# Patient Record
Sex: Female | Born: 1975 | Race: Black or African American | Hispanic: No | Marital: Married | Smoking: Never smoker
Health system: Southern US, Community
[De-identification: ages and names within clinical notes are randomized; demographics above are authoritative.]

## PROBLEM LIST (undated history)

## (undated) DIAGNOSIS — K5900 Constipation, unspecified: Secondary | ICD-10-CM

## (undated) DIAGNOSIS — IMO0001 Reserved for inherently not codable concepts without codable children: Secondary | ICD-10-CM

## (undated) DIAGNOSIS — R7309 Other abnormal glucose: Secondary | ICD-10-CM

## (undated) DIAGNOSIS — Z789 Other specified health status: Secondary | ICD-10-CM

## (undated) DIAGNOSIS — J302 Other seasonal allergic rhinitis: Secondary | ICD-10-CM

## (undated) DIAGNOSIS — R7989 Other specified abnormal findings of blood chemistry: Secondary | ICD-10-CM

## (undated) DIAGNOSIS — K219 Gastro-esophageal reflux disease without esophagitis: Secondary | ICD-10-CM

## (undated) DIAGNOSIS — D219 Benign neoplasm of connective and other soft tissue, unspecified: Secondary | ICD-10-CM

## (undated) HISTORY — DX: Reserved for inherently not codable concepts without codable children: IMO0001

## (undated) HISTORY — PX: COLONOSCOPY: SHX174

## (undated) HISTORY — DX: Other specified abnormal findings of blood chemistry: R79.89

## (undated) HISTORY — PX: WISDOM TOOTH EXTRACTION: SHX21

## (undated) HISTORY — PX: BREAST SURGERY: SHX581

## (undated) HISTORY — DX: Other specified health status: Z78.9

## (undated) HISTORY — DX: Benign neoplasm of connective and other soft tissue, unspecified: D21.9

## (undated) HISTORY — DX: Other abnormal glucose: R73.09

## (undated) HISTORY — PX: ESOPHAGOGASTRODUODENOSCOPY ENDOSCOPY: SHX5814

---

## 1998-04-17 ENCOUNTER — Other Ambulatory Visit: Admission: RE | Admit: 1998-04-17 | Discharge: 1998-04-17 | Payer: Self-pay | Admitting: Gynecology

## 1998-06-10 HISTORY — PX: REDUCTION MAMMAPLASTY: SUR839

## 1998-09-30 ENCOUNTER — Emergency Department (HOSPITAL_COMMUNITY): Admission: EM | Admit: 1998-09-30 | Discharge: 1998-09-30 | Payer: Self-pay | Admitting: Emergency Medicine

## 1998-10-24 ENCOUNTER — Other Ambulatory Visit: Admission: RE | Admit: 1998-10-24 | Discharge: 1998-10-24 | Payer: Self-pay | Admitting: Gynecology

## 1999-05-24 ENCOUNTER — Encounter (INDEPENDENT_AMBULATORY_CARE_PROVIDER_SITE_OTHER): Payer: Self-pay | Admitting: Specialist

## 1999-05-24 ENCOUNTER — Other Ambulatory Visit: Admission: RE | Admit: 1999-05-24 | Discharge: 1999-05-24 | Payer: Self-pay | Admitting: Plastic Surgery

## 1999-12-11 ENCOUNTER — Other Ambulatory Visit: Admission: RE | Admit: 1999-12-11 | Discharge: 1999-12-11 | Payer: Self-pay | Admitting: Gynecology

## 2001-02-10 ENCOUNTER — Other Ambulatory Visit: Admission: RE | Admit: 2001-02-10 | Discharge: 2001-02-10 | Payer: Self-pay | Admitting: Gynecology

## 2001-04-16 ENCOUNTER — Other Ambulatory Visit: Admission: RE | Admit: 2001-04-16 | Discharge: 2001-04-16 | Payer: Self-pay | Admitting: Gynecology

## 2002-03-24 ENCOUNTER — Other Ambulatory Visit: Admission: RE | Admit: 2002-03-24 | Discharge: 2002-03-24 | Payer: Self-pay | Admitting: Gynecology

## 2003-04-04 ENCOUNTER — Other Ambulatory Visit: Admission: RE | Admit: 2003-04-04 | Discharge: 2003-04-04 | Payer: Self-pay | Admitting: Gynecology

## 2004-04-05 ENCOUNTER — Other Ambulatory Visit: Admission: RE | Admit: 2004-04-05 | Discharge: 2004-04-05 | Payer: Self-pay | Admitting: Gynecology

## 2005-04-24 ENCOUNTER — Other Ambulatory Visit: Admission: RE | Admit: 2005-04-24 | Discharge: 2005-04-24 | Payer: Self-pay | Admitting: Gynecology

## 2006-05-05 ENCOUNTER — Other Ambulatory Visit: Admission: RE | Admit: 2006-05-05 | Discharge: 2006-05-05 | Payer: Self-pay | Admitting: Gynecology

## 2007-05-04 ENCOUNTER — Other Ambulatory Visit: Admission: RE | Admit: 2007-05-04 | Discharge: 2007-05-04 | Payer: Self-pay | Admitting: Gynecology

## 2008-05-23 ENCOUNTER — Ambulatory Visit: Payer: Self-pay | Admitting: Gynecology

## 2008-05-23 ENCOUNTER — Other Ambulatory Visit: Admission: RE | Admit: 2008-05-23 | Discharge: 2008-05-23 | Payer: Self-pay | Admitting: Gynecology

## 2008-05-23 ENCOUNTER — Encounter: Payer: Self-pay | Admitting: Gynecology

## 2009-11-28 ENCOUNTER — Inpatient Hospital Stay (HOSPITAL_COMMUNITY): Admission: RE | Admit: 2009-11-28 | Discharge: 2009-11-30 | Payer: Self-pay | Admitting: Obstetrics and Gynecology

## 2009-11-28 ENCOUNTER — Encounter (INDEPENDENT_AMBULATORY_CARE_PROVIDER_SITE_OTHER): Payer: Self-pay | Admitting: Obstetrics and Gynecology

## 2009-11-29 HISTORY — PX: MYOMECTOMY: SHX85

## 2010-01-07 ENCOUNTER — Encounter: Admission: RE | Admit: 2010-01-07 | Discharge: 2010-01-07 | Payer: Self-pay | Admitting: Obstetrics and Gynecology

## 2010-07-01 ENCOUNTER — Encounter: Payer: Self-pay | Admitting: Obstetrics and Gynecology

## 2010-08-26 LAB — URINALYSIS, ROUTINE W REFLEX MICROSCOPIC
Glucose, UA: NEGATIVE mg/dL
Ketones, ur: NEGATIVE mg/dL
Protein, ur: NEGATIVE mg/dL
pH: 6 (ref 5.0–8.0)

## 2010-08-26 LAB — CBC
Hemoglobin: 12.4 g/dL (ref 12.0–15.0)
Hemoglobin: 9.9 g/dL — ABNORMAL LOW (ref 12.0–15.0)
MCHC: 34.3 g/dL (ref 30.0–36.0)
RBC: 3.01 MIL/uL — ABNORMAL LOW (ref 3.87–5.11)
RDW: 13.2 % (ref 11.5–15.5)
WBC: 5.8 10*3/uL (ref 4.0–10.5)
WBC: 8.3 10*3/uL (ref 4.0–10.5)

## 2010-08-26 LAB — PROTIME-INR: INR: 0.99 (ref 0.00–1.49)

## 2010-08-26 LAB — COMPREHENSIVE METABOLIC PANEL
Calcium: 9.2 mg/dL (ref 8.4–10.5)
Chloride: 107 mEq/L (ref 96–112)
GFR calc non Af Amer: >60 mL/min (ref >60)

## 2010-08-26 LAB — URINE MICROSCOPIC-ADD ON

## 2010-08-26 LAB — PREGNANCY, URINE: Preg Test, Ur: NEGATIVE

## 2011-01-01 ENCOUNTER — Other Ambulatory Visit: Payer: Self-pay | Admitting: Obstetrics and Gynecology

## 2011-01-01 DIAGNOSIS — R928 Other abnormal and inconclusive findings on diagnostic imaging of breast: Secondary | ICD-10-CM

## 2011-01-15 ENCOUNTER — Other Ambulatory Visit: Payer: Self-pay

## 2011-01-18 ENCOUNTER — Ambulatory Visit
Admission: RE | Admit: 2011-01-18 | Discharge: 2011-01-18 | Disposition: A | Discharge status: Home / Self Care | Payer: 59 | Source: Ambulatory Visit | Attending: Obstetrics and Gynecology | Admitting: Obstetrics and Gynecology

## 2011-01-18 DIAGNOSIS — R928 Other abnormal and inconclusive findings on diagnostic imaging of breast: Secondary | ICD-10-CM

## 2011-04-12 ENCOUNTER — Other Ambulatory Visit: Payer: Self-pay | Admitting: Obstetrics and Gynecology

## 2014-02-25 ENCOUNTER — Other Ambulatory Visit: Payer: Self-pay | Admitting: Gastroenterology

## 2014-04-22 ENCOUNTER — Other Ambulatory Visit: Payer: Self-pay | Admitting: Physician Assistant

## 2016-04-29 ENCOUNTER — Other Ambulatory Visit: Payer: Self-pay | Admitting: Obstetrics and Gynecology

## 2016-04-29 DIAGNOSIS — D259 Leiomyoma of uterus, unspecified: Secondary | ICD-10-CM

## 2016-05-07 ENCOUNTER — Ambulatory Visit
Admission: RE | Admit: 2016-05-07 | Discharge: 2016-05-07 | Disposition: A | Discharge status: Home / Self Care | Payer: 59 | Source: Ambulatory Visit | Attending: Obstetrics and Gynecology | Admitting: Obstetrics and Gynecology

## 2016-05-07 ENCOUNTER — Other Ambulatory Visit (HOSPITAL_COMMUNITY): Payer: Self-pay | Admitting: Interventional Radiology

## 2016-05-07 DIAGNOSIS — D251 Intramural leiomyoma of uterus: Secondary | ICD-10-CM

## 2016-05-07 DIAGNOSIS — D259 Leiomyoma of uterus, unspecified: Secondary | ICD-10-CM

## 2016-05-07 HISTORY — PX: IR GENERIC HISTORICAL: IMG1180011

## 2016-05-07 NOTE — Consult Note (Signed)
Chief Complaint:  systematic uterine fibroids with dysfunctional uterine bleeding.  Referring Physician(s): Callahan,Sidney  History of Present Illness: Penny Edwards is a 40 y.o. female with known symptomatic uterine fibroids. Her abnormal menstrual bleeding has progressed. Review of her menstrual cycle was performed. She has a 7 day cycle with 3 heavy days and passage of fresh blood clots. Frequency of pad changes every 2 hours. She now experiences interperiod bleeding as well. Fibroids were originally diagnosed 2011. She has had a remote open myomectomy in June 2011. No future pregnancy plans. No menopausal symptoms. She currently is on birth control pills. No recent GYN infections. Office ultrasound demonstrates at least 8 uterine fibroids with an overall uterine length of 16.5 cm.  No past medical history on file.  No past surgical history on file.  Allergies: Patient has no known allergies.  Medications: Prior to Admission medications   Medication Sig Start Date End Date Taking? Authorizing Provider  norethindrone-ethinyl estradiol (JUNEL FE,GILDESS FE,LOESTRIN FE) 1-20 MG-MCG tablet Take 1 tablet by mouth daily.   Yes Historical Provider, MD     No family history on file.  Social History   Social History  . Marital status: Married    Spouse name: N/A  . Number of children: N/A  . Years of education: N/A   Social History Main Topics  . Smoking status: Never Smoker  . Smokeless tobacco: Never Used  . Alcohol use Yes     Comment: Occasional    . Drug use: No  . Sexual activity: Not on file   Other Topics Concern  . Not on file   Social History Narrative  . No narrative on file     Review of Systems: A 12 point ROS discussed and pertinent positives are indicated in the HPI above.  All other systems are negative.  Review of Systems  Vital Signs: BP (!) 134/91 (BP Location: Left Arm, Patient Position: Sitting, Cuff Size: Normal)   Pulse 74   Temp  98.6 F (37 C) (Oral)   Resp 14   Ht 5\' 6"  (1.676 m)   Wt 197 lb (89.4 kg)   LMP 04/30/2016 (Exact Date)   SpO2 100%   BMI 31.80 kg/m   Physical Exam  Constitutional: She is oriented to person, place, and time. She appears well-developed and well-nourished. No distress.  Eyes: Conjunctivae are normal. No scleral icterus.  Cardiovascular: Normal rate, regular rhythm, normal heart sounds and intact distal pulses.   No murmur heard. Pulmonary/Chest: Effort normal. No respiratory distress. She has no wheezes.  Abdominal: Soft. Bowel sounds are normal. She exhibits no distension. There is no tenderness.  Palpable firm nontender lower abdominal mass extends to the pelvis compatible with an enlarged fibroid uterus. This is approximately 4 cm below the umbilicus compatible with approximate 16 weeks' size.  Musculoskeletal: She exhibits no edema or tenderness.  Neurological: She is alert and oriented to person, place, and time.  Skin: Skin is warm and dry. She is not diaphoretic. No erythema.  Psychiatric: She has a normal mood and affect. Her behavior is normal. Thought content normal.    Mallampati Score:   2  Imaging: No results found.  Labs:  CBC: No results for input(s): WBC, HGB, HCT, PLT in the last 8760 hours.  COAGS: No results for input(s): INR, APTT in the last 8760 hours.  BMP: No results for input(s): NA, K, CL, CO2, GLUCOSE, BUN, CALCIUM, CREATININE, GFRNONAA, GFRAA in the last  8760 hours.  Invalid input(s): CMP  LIVER FUNCTION TESTS: No results for input(s): BILITOT, AST, ALT, ALKPHOS, PROT, ALBUMIN in the last 8760 hours.  Assessment and Plan:  Recurrent enlarging systematic uterine fibroids despite 2011 myomectomy. Recurrence of the dysfunctional uterine bleeding with passage of blood clots and interperiod bleeding. Uterine fibroid embolization procedure was reviewed in detail. The procedure, risks, benefits and alternatives were reviewed as well as the  outcomes and goals. All questions were addressed. She has a clear understanding of the procedure.  She would like to proceed with the workup. This would include a pelvic MRI without and with contrast to assess fibroid anatomy for embolization.  Plan: Schedule for outpatient pelvic MRI without and with contrast. Obtain recent Pap smear results Anticipate uterine fibroid embolization in the next 4-12 weeks. She will need to be off the birth control pills for the procedure.  Thank you for this interesting consult.  I greatly enjoyed meeting PETE STRIANO and look forward to participating in their care.  A copy of this report was sent to the requesting provider on this date.  Electronically Signed: Greggory Keen 05/07/2016, 10:41 AM   I spent a total of  40 Minutes   in face to face in clinical consultation, greater than 50% of which was counseling/coordinating care for this patient with recurrent systematic uterine fibroids and abnormal menstrual bleeding.

## 2016-05-13 ENCOUNTER — Other Ambulatory Visit (HOSPITAL_COMMUNITY): Payer: 59

## 2016-05-14 ENCOUNTER — Other Ambulatory Visit: Payer: Self-pay | Admitting: Obstetrics and Gynecology

## 2016-05-29 ENCOUNTER — Encounter: Payer: Self-pay | Admitting: Interventional Radiology

## 2016-06-12 ENCOUNTER — Ambulatory Visit (INDEPENDENT_AMBULATORY_CARE_PROVIDER_SITE_OTHER): Payer: 59 | Admitting: Obstetrics and Gynecology

## 2016-06-12 ENCOUNTER — Encounter: Payer: Self-pay | Admitting: Obstetrics and Gynecology

## 2016-06-12 VITALS — BP 140/82 | HR 80 | Resp 16 | Ht 66.0 in | Wt 203.2 lb

## 2016-06-12 DIAGNOSIS — D259 Leiomyoma of uterus, unspecified: Secondary | ICD-10-CM

## 2016-06-12 DIAGNOSIS — N921 Excessive and frequent menstruation with irregular cycle: Secondary | ICD-10-CM

## 2016-06-12 LAB — CBC
HEMATOCRIT: 38.5 % (ref 35.0–45.0)
HEMOGLOBIN: 12.8 g/dL (ref 11.7–15.5)
MCH: 32.1 pg (ref 27.0–33.0)
MCHC: 33.2 g/dL (ref 32.0–36.0)
MCV: 96.5 fL (ref 80.0–100.0)
MPV: 9.3 fL (ref 7.5–12.5)
Platelets: 270 10*3/uL (ref 140–400)
RBC: 3.99 MIL/uL (ref 3.80–5.10)
RDW: 13.9 % (ref 11.0–15.0)
WBC: 6.4 10*3/uL (ref 3.8–10.8)

## 2016-06-12 LAB — TSH: TSH: 1.32 m[IU]/L

## 2016-06-12 NOTE — Progress Notes (Signed)
41 y.o. G0 Married [place] American female here for second opinion regarding uterine fibroids.    Patient is a prior patient of mine and has hx of fibroids.  She is status post myomectomy 11/29/09. She did not have all fibroids removed at that time, as she desired fertility and had a fibroid that I thought would cause her to have hysterectomy if it were removed.  Now is having heavy cycles and breakthrough bleeding.  First 3 days, menses are really heavy with pad change every 3 hours.  Having abdominal tightness.  No back pain.  No urinary frequency or incontinence.  No dyspareunia. Clothing tight in abdomen.  Early satiety.  Cannot lay on her abdomen due to the size of her abdomen.  Uncomfortable.   Has been consulting with Dr. Rogue Bussing of [facility]. Had ultrasound in November 2017 - multiple fibroids with rough average size each of: 5 cm, 3 cm, 2.7 cm, 7.5 cm, 4 cm, 4.5 cm, 2.5 cm Had EMB on 05/14/16 showing benign endometrium with progestional effect. No atypia or malignancy noted.   Had consultation for uterine artery embolization.  Scheduled for an MRI in a couple of weeks.   Not planning on childbearing.  No hx of ovarian, uterine cancer.  M aunt with breast cancer.   PCP:   None  Patient's last menstrual period was 05/28/2016 (exact date).     Period Cycle (Days): 30 Period Duration (Days): 5-6 Period Pattern: Regular (having breakthrough bleeding) Menstrual Flow: Heavy Menstrual Control: Maxi pad Menstrual Control Change Freq (Hours): every 1.5 hours Dysmenorrhea: (!) Mild Dysmenorrhea Symptoms: Cramping, Diarrhea     Sexually active: Yes.   female The current method of family planning is OCP (estrogen/progesterone)--Loestrin 1/20.    Exercising: No.   Smoker:  no  Health Maintenance: Pap:  04-27-13 Neg:Neg HR HPV--patient thinks she had pap 05-01-16 which was normal.  There is no documentation in her chart that this was done in 2017 upon further review  after her office visit was complete today.  History of abnormal Pap:  no MMG:  05-01-16 Neg, BiRads1 with [facility] Colonoscopy:  2015 negative;next due 2018. Done for acid reflux periodically during each year from Feb to May.  BMD:   n/a  Result  n/a TDaP:  Up to date Gardasil:   NA   reports that she has never smoked. She has never used smokeless tobacco. She reports that she drinks about 2.4 oz of alcohol per week . She reports that she does not use drugs.  Past Medical History:  Diagnosis Date  . Fibroids   . Patient is Jehovah's Witness     Past Surgical History:  Procedure Laterality Date  . BREAST SURGERY     breast reduction  . IR GENERIC HISTORICAL  05/07/2016   IR RADIOLOGIST EVAL & MGMT 05/07/2016 Greggory Keen, MD [facility] INTERV RAD  . MYOMECTOMY  11/29/2009   Dr.Sherif Millspaugh Quincy Simmonds    Current Outpatient Prescriptions  Medication Sig Dispense Refill  . norethindrone-ethinyl estradiol (JUNEL FE,GILDESS FE,LOESTRIN FE) 1-20 MG-MCG tablet Take 1 tablet by mouth daily.     No current facility-administered medications for this visit.     Family History  Problem Relation Age of Onset  . Diabetes Mother   . Thyroid disease Mother     hyperthyroid  . Diabetes Father   . Hypertension Father   . Hypertension Sister   . Breast cancer Maternal Aunt 58    A & W  . Stroke  Paternal Grandmother   . Hypertension Sister     ROS:  Pertinent items are noted in HPI.  Otherwise, a comprehensive ROS was negative.  Exam:   BP 140/82 (BP Location: Right Arm, Patient Position: Sitting, Cuff Size: Normal)   Pulse 80   Resp 16   Ht 5\' 6"  (1.676 m)   Wt 203 lb 3.2 oz (92.2 kg)   LMP 05/28/2016 (Exact Date)   BMI 32.80 kg/m     General appearance: alert, cooperative and appears stated age Head: Normocephalic, without obvious abnormality, atraumatic Neck: no adenopathy, supple, symmetrical, trachea midline and thyroid normal to inspection and palpation Lungs: clear to  auscultation bilaterally Heart: regular rate and rhythm Abdomen: 25 week size uterus, fills pelvis side to side, non-tender; no masses, no organomegaly Extremities: extremities normal, atraumatic, no cyanosis or edema Skin: Skin color, texture, turgor normal. No rashes or lesions.  No abnormal inguinal nodes palpated Neurologic: Grossly normal  Pelvic: External genitalia:  no lesions              Urethra:  normal appearing urethra with no masses, tenderness or lesions              Bartholins and Skenes: normal                 Vagina: normal appearing vagina with normal color and discharge, no lesions              Cervix: no lesions             Bimanual Exam:  Uterus:   25 week size uterus, fills pelvis.              Adnexa: no mass, fullness, tenderness              Rectal exam: Yes.  .  Confirms.              Anus:  normal sphincter tone, no lesions  Chaperone was present for exam.  Assessment:   Symptomatic fibroids. Large uterine size.  Status post abdominal myomectomy. Jehovah's witness. On combined OCPs.  Plan:   We discussed hysterectomy versus uterine artery embolization.  Embolization will not give her significant relief of the volume that the uterus occupies at this time.  We discussed Depo Lupron therapy for at least 3 months, possibly longer in order to try to get a 40% reduction in her uterine volume prior to hysterectomy.  We will have her return in 3 months to assess her process with the Depo Lupron.  In planning for surgery, I would have her potentially do a uterine artery embolization pre-op in order to reduce bleeding at the time of hysterectomy. Routes of surgery discussed including abdominal and laparoscopic hysterectomy.  Check TSH and CBC today.  Patient wishes to proceed forward with her care of fibroids through this office.  She will cancel her pelvic MRI for now.  We can address this later when she has her anticipated presurgery embolization done.  We  will need a copy of her documents outlining her wishes for avoidance or acceptance of blood and blood substitutes. Follow up annually and prn.   _45______ minutes face to face time of which over 50% was spent in counseling.     After visit summary provided.

## 2016-06-12 NOTE — Progress Notes (Signed)
Order for Lupron Depot 11.25 mg faxed to [employer] for precert. Discussed with patient and aware to expect call regarding benefit information.

## 2016-06-13 ENCOUNTER — Encounter: Payer: Self-pay | Admitting: Obstetrics and Gynecology

## 2016-06-18 ENCOUNTER — Telehealth: Payer: Self-pay | Admitting: Obstetrics and Gynecology

## 2016-06-18 ENCOUNTER — Other Ambulatory Visit (HOSPITAL_COMMUNITY): Payer: 59

## 2016-06-18 NOTE — Telephone Encounter (Signed)
Patient is calling to let Gay Filler know she has not heard from her insurance yet regarding Lupron.

## 2016-06-18 NOTE — Telephone Encounter (Signed)
Prior authorization form to your desk for review and signature. Will close encounter and await Lupron arrival.  Patient is currently on OCP and is aware the Lupron is to be given with next cycle.   Routing to provider for final review. Patient agreeable to disposition. Will close encounter.

## 2016-06-18 NOTE — Telephone Encounter (Signed)
Return call to [place] at [employer]. Arbie Cookey unavailable. Spoke to [place]. Confirming patient benefit fax received and prior auth info had been sent. Advised this information has been received and is in process.

## 2016-06-18 NOTE — Telephone Encounter (Signed)
Returned call from patient. She states she has received call from [employer] regarding Lupron and is aware it is now being sent to speciality pharmacy. Advised we have received this information as well and are working on prior authorization forms for [employer]. She should expect to hear from them when ready to ship.

## 2016-06-18 NOTE — Telephone Encounter (Signed)
Patient's pharmacy called requesting to speak with Gay Filler re: Lupron.  They'd like to know if we received the prior authorization details yet.

## 2016-06-18 NOTE — Telephone Encounter (Signed)
Return call to patient. Left message to call back. 

## 2016-06-19 ENCOUNTER — Telehealth: Payer: Self-pay | Admitting: Obstetrics and Gynecology

## 2016-06-19 NOTE — Telephone Encounter (Signed)
Spoke with [employer]. Verification needed on Rx sent for Lupron from [facility]. Advised Rx is to be for Lupron Depot 11.25 mg (3 month supply) administer IM once every 3 months #1 kit 1RF. Representative verbalizes understanding. States PA submitted to [facility] today is still under review and our office will be contacted once a decision has been made.  Cc: Lamont Snowball, RN  Routing to provider for final review. Patient agreeable to disposition. Will close encounter.

## 2016-06-19 NOTE — Telephone Encounter (Signed)
[facility] needs clarification on a script that was sent in to them. Phone # [phone] Ref# [id number]

## 2016-06-20 ENCOUNTER — Telehealth: Payer: Self-pay | Admitting: Obstetrics and Gynecology

## 2016-06-20 DIAGNOSIS — D259 Leiomyoma of uterus, unspecified: Secondary | ICD-10-CM

## 2016-06-20 NOTE — Telephone Encounter (Signed)
Return call to patient. She received call from [facility] regarding Lupron. Had questions regarding copay card.  After confirming with Lupron rep Darnelle Maffucci), advised co pay card provides $125 off one month and $250 off 3 month supply. She should call [facility] and advise them she has a copay card. Call back to patient and information given. Advised we have limited ability to provide any further information regarding pharmacy plan. Patient will call [facility] and let me know if any further issues.

## 2016-06-20 NOTE — Telephone Encounter (Signed)
Patient wants to speak with Gay Filler.

## 2016-06-21 NOTE — Telephone Encounter (Signed)
Lupron injection received and patient notified. States she is on oral contraceptive pills and menses is due next week. She will call with menses. Advised Dr Quincy Simmonds will review and we will notify her of any changes to current instruction.   Per Dr Elza Rafter original order, plan Lupron Depot 11.25 mg and then have office visit in 3 months for re-assessment. Will schedule in 10 weeks with pelvic ultrasound.   Routing to provider for final review. Patient agreeable to disposition. Will close encounter.

## 2016-06-24 ENCOUNTER — Telehealth: Payer: Self-pay | Admitting: Obstetrics and Gynecology

## 2016-06-24 NOTE — Telephone Encounter (Signed)
Spoke with patient. Patient started her menses today and would like to schedule her Lupron injection. Injection has arrived at the office. Patient is unable to come today for injection. Appointment scheduled for tomorrow 06/25/2016 at 9 am. Patient is agreeable to date and time. 10 week follow up PUS scheduled on 08/29/2016 at 3 pm with 3:30 pm consult with Dr.Silva. Patient is agreeable to date and time.  Cc: Lamont Snowball, RN  Routing to provider for final review. Patient agreeable to disposition. Will close encounter.

## 2016-06-24 NOTE — Telephone Encounter (Signed)
Patient started cycle today and calling to schedule an appointment for the lupron injection.

## 2016-06-25 ENCOUNTER — Ambulatory Visit (INDEPENDENT_AMBULATORY_CARE_PROVIDER_SITE_OTHER): Payer: 59

## 2016-06-25 VITALS — BP 120/80 | HR 68 | Resp 12 | Ht 66.0 in | Wt 201.4 lb

## 2016-06-25 DIAGNOSIS — D259 Leiomyoma of uterus, unspecified: Secondary | ICD-10-CM | POA: Diagnosis not present

## 2016-06-25 MED ORDER — LEUPROLIDE ACETATE (3 MONTH) 11.25 MG IM KIT
11.2500 mg | PACK | Freq: Once | INTRAMUSCULAR | Status: AC
  Administered 2016-06-25: 11.25 mg via INTRAMUSCULAR

## 2016-06-25 NOTE — Progress Notes (Signed)
41 y.o. Married [place] American female here for her first Depo Lupron injection.  Indication for injection:  Uterine Leiomyoma  LMP:  06/24/16  Contraception:  Finished OCP pack on Saturday 06/22/16. Advised to not continue OCP's from here on out.   Lupron order:  11.25mg  IM x 3 month dosages.    Order to administer given by Dr. Quincy Simmonds on June 12, 2016.  Patient received injection in [place].   Patient tolerated well.   Routed to provider and encounter closed.

## 2016-07-12 ENCOUNTER — Telehealth: Payer: Self-pay | Admitting: Obstetrics and Gynecology

## 2016-07-12 NOTE — Telephone Encounter (Signed)
Spoke with patient. Patient received her first Lupron injection on 06/25/2016. Lupron 11.25 mg IM q 3 months. Advised patient she is not due for her next injection until September 23 2016. Patient will need to contact [employer] at the end of March to authorize shipment of the medication to the office. Once shipment is scheduled she will be scheduled for her next injection. Patient is agreeable and verbalizes understanding. Aware she will need to keep her appointment for 10 week PUS on 08/29/2016 with Dr.Silva. Patient verbalizes understanding.  Routing to provider for final review. Patient agreeable to disposition. Will close encounter.

## 2016-07-12 NOTE — Telephone Encounter (Signed)
Patient called with questions about her Lupron shot and how frequently she needs it.

## 2016-08-08 ENCOUNTER — Telehealth: Payer: Self-pay | Admitting: Obstetrics and Gynecology

## 2016-08-08 NOTE — Telephone Encounter (Signed)
Called patient to review benefits for a recommended ultrasound. Left Voicemail requesting a call back. °

## 2016-08-08 NOTE — Telephone Encounter (Signed)
Spoke with patient regarding benefit for ultrasound. Patient understood and agreeable. Patient scheduled 08/29/16 with Dr Quincy Simmonds. Patient aware of date, arrival time and cancellation policy. No further questions. Ok to close

## 2016-08-29 ENCOUNTER — Ambulatory Visit (INDEPENDENT_AMBULATORY_CARE_PROVIDER_SITE_OTHER): Payer: 59 | Admitting: Obstetrics and Gynecology

## 2016-08-29 ENCOUNTER — Encounter: Payer: Self-pay | Admitting: Obstetrics and Gynecology

## 2016-08-29 ENCOUNTER — Ambulatory Visit (INDEPENDENT_AMBULATORY_CARE_PROVIDER_SITE_OTHER): Payer: 59

## 2016-08-29 VITALS — BP 112/78 | HR 74 | Resp 12 | Ht 66.0 in | Wt 201.0 lb

## 2016-08-29 DIAGNOSIS — D259 Leiomyoma of uterus, unspecified: Secondary | ICD-10-CM | POA: Diagnosis not present

## 2016-08-29 DIAGNOSIS — N912 Amenorrhea, unspecified: Secondary | ICD-10-CM

## 2016-08-29 NOTE — Progress Notes (Signed)
Patient ID: Penny Edwards, female   DOB: Nov 25, 1975, 41 y.o.   MRN: [id number] GYNECOLOGY  VISIT   HPI: 41 y.o.   Married  [place] American  female   [id number] with No LMP recorded. Patient has had an injection.   here for pelvic ultrasound for uterine fibroids.    Known uterine fibroids and 25 week size uterus.  She is planning for hysterectomy and declines future childbearing. Has treated with Depo Lupron 11.25 mg once.  No more cycles.  Does not feel different in her abdomen.  In fact, she feels larger.   Had ultrasound at [facility] in November 2017 - multiple fibroids with rough average size each of: 5 cm, 3 cm, 2.7 cm, 7.5 cm, 4 cm, 4.5 cm, 2.5 cm Had EMB on 05/14/16 showing benign endometrium with progestional effect. No atypia or malignancy noted.   Protected intercourse this am.   GYNECOLOGIC HISTORY: No LMP recorded. Patient has had an injection. Contraception:  Condoms Menopausal hormone therapy:  n/a Last mammogram:   05-01-16 Neg, BiRads1 with [facility] Last pap smear: 04-27-13 Neg:Neg HR HPV--patient thinks she had pap 05-01-16 which was normal.  There is no documentation in her chart that this was done in 2017 upon further review after her office visit was complete today.             OB History    Gravida Para Term Preterm AB Living   0 0 0 0 0 0   SAB TAB Ectopic Multiple Live Births   0 0 0 0 0         There are no active problems to display for this patient.   Past Medical History:  Diagnosis Date  . Fibroids   . Patient is Jehovah's Witness     Past Surgical History:  Procedure Laterality Date  . BREAST SURGERY     breast reduction  . IR GENERIC HISTORICAL  05/07/2016   IR RADIOLOGIST EVAL & MGMT 05/07/2016 Greggory Keen, MD [facility] INTERV RAD  . MYOMECTOMY  11/29/2009   Dr.Suhayb Anzalone Quincy Simmonds    No current outpatient prescriptions on file.   No current facility-administered medications for this visit.      ALLERGIES: Patient has  no known allergies.  Family History  Problem Relation Age of Onset  . Diabetes Mother   . Thyroid disease Mother     hyperthyroid  . Diabetes Father   . Hypertension Father   . Hypertension Sister   . Breast cancer Maternal Aunt 60    A & W  . Stroke Paternal Grandmother   . Hypertension Sister     Social History   Social History  . Marital status: Married    Spouse name: N/A  . Number of children: N/A  . Years of education: N/A   Occupational History  . Not on file.   Social History Main Topics  . Smoking status: Never Smoker  . Smokeless tobacco: Never Used  . Alcohol use 2.4 oz/week    2 Glasses of wine, 2 Shots of liquor per week     Comment: Occasional    . Drug use: No  . Sexual activity: Yes    Partners: Male    Birth control/ protection: Pill     Comment: Loestrin   Other Topics Concern  . Not on file   Social History Narrative  . No narrative on file    ROS:  Pertinent items are noted in HPI.  PHYSICAL EXAMINATION:  BP 112/78 (BP Location: Left Arm, Patient Position: Sitting, Cuff Size: Normal)   Pulse 74   Resp 12   Ht 5\' 6"  (1.676 m)   Wt 201 lb (91.2 kg)   BMI 32.44 kg/m     General appearance: alert, cooperative and appears stated age Abdomen:  Uterus to just above umbilicus, nontender.  Pelvic ultrasound: Uterus with fibroids:  3.97 cm, 7.49 cm, 5.04 cm, 2.82 cm, 10.41 cm.  Uterine size 12 x 12 x almost 8 cm.  Endometrium obscured by fibroids.  Ovaries normal.  No free fluid.   ASSESSMENT  Uterine fibroids.  Status post Depo Lupron 11.25 x 1.  Hx myomectomy.  Jehovah's Witness.   PLAN  Will proceed with an additional Depo Lupron 11.25 mg x 1.  Quantitative hCG done today.  She knows not to have any unprotected intercourse as she is currently unprotected against pregnancy. She declines uterine artery embolization in preparation for surgery.  Will proceed with hysterectomy in May or June.  We will attempt laparoscopic  hysterectomy with the understanding that she may need an abdominal incision depending on her response to the next Depo Lupron injection and any adhesions she may have from prior surgery.  ACOg handout of hysterectomy.  We will need to get a copy of her Jehovah's Witness papers indicating her wishes to receive or not any blood products or volume expanders.  She will need a pap smear done when she receives her next Depo Lupron injection.    An After Visit Summary was printed and given to the patient.  __25____ minutes face to face time of which over 50% was spent in counseling.

## 2016-08-29 NOTE — Progress Notes (Signed)
Encounter reviewed by Dr. Brook Amundson C. Silva.  

## 2016-08-30 ENCOUNTER — Telehealth: Payer: Self-pay | Admitting: *Deleted

## 2016-08-30 LAB — HCG, QUANTITATIVE, PREGNANCY

## 2016-08-30 NOTE — Telephone Encounter (Signed)
Call to patient. Advised serum HCG is negative per Dr Quincy Simmonds. Discussed options for surgical dates and scheduling next Lupron injection. Patient has refill available and will call [facility] to re-order. Will call back after discussing dates with husband and ready to schedule Lupron appointment.

## 2016-09-02 NOTE — Telephone Encounter (Signed)
Reviewed surgery dates with Dr Quincy Simmonds. Lupron injection as scheduled for April would allow for surgery in July if patient prefers.  Call to patient. Left message to call back.

## 2016-09-04 NOTE — Telephone Encounter (Signed)
Called patient to review benefits for a recommended surgical procedure. Left Voicemail requesting a call back.

## 2016-09-09 NOTE — Telephone Encounter (Signed)
Patient returned call. Reviewed benefit for recommended surgery. Patient understood and agreeable. Patient ready to schedule. Patient provided surgery deposit over the phone. Patient aware this is professional benefit only. Patient aware will be contacted by hospital for separate benefits. Forwarding to nurse supervisor for scheduling.  Routing to [employer]

## 2016-09-10 NOTE — Telephone Encounter (Signed)
Return call from patient. Lupron was ordered by patient (and has already been received in office). Appointment scheduled for 09-18-16 with Dr Quincy Simmonds for pap and Lupron injection. First Lupron was given 06-25-16.  Patient states mid July surgery date will not work for her. She really needs end of June date. Advised will need to discuss this with Dr Quincy Simmonds and call her back. She is aware that Dr Quincy Simmonds is out of office in surgery today and this will take several days before I am able to get back to her regarding late June surgery options.   Routing to Dr Quincy Simmonds for review.

## 2016-09-13 NOTE — Telephone Encounter (Signed)
Message left per DPR, letting patient know her surgery was scheduled for 12/02/16 at 0730. Also reminded patient of upcoming appointment on 09/18/16 at 1530. Instructed patient to see Gay Filler at office visit to further discuss surgery. Advised patient to return call if she had any additional questions.   Routing to provider for final review. Will close encounter.    cc Lamont Snowball, RN

## 2016-09-18 ENCOUNTER — Ambulatory Visit (INDEPENDENT_AMBULATORY_CARE_PROVIDER_SITE_OTHER): Payer: 59 | Admitting: Obstetrics and Gynecology

## 2016-09-18 ENCOUNTER — Encounter: Payer: Self-pay | Admitting: Obstetrics and Gynecology

## 2016-09-18 VITALS — BP 130/68 | HR 80 | Ht 66.0 in | Wt 202.8 lb

## 2016-09-18 DIAGNOSIS — Z124 Encounter for screening for malignant neoplasm of cervix: Secondary | ICD-10-CM

## 2016-09-18 DIAGNOSIS — D259 Leiomyoma of uterus, unspecified: Secondary | ICD-10-CM | POA: Diagnosis not present

## 2016-09-18 DIAGNOSIS — Z01812 Encounter for preprocedural laboratory examination: Secondary | ICD-10-CM | POA: Diagnosis not present

## 2016-09-18 LAB — POCT URINE PREGNANCY: PREG TEST UR: NEGATIVE

## 2016-09-18 MED ORDER — LEUPROLIDE ACETATE (3 MONTH) 11.25 MG IM KIT
11.2500 mg | PACK | Freq: Once | INTRAMUSCULAR | Status: AC
  Administered 2016-09-18: 11.25 mg via INTRAMUSCULAR

## 2016-09-18 NOTE — Progress Notes (Signed)
GYNECOLOGY  VISIT   HPI: 41 y.o.   Married  [place] American  female   G0P0000 with No LMP recorded. Patient has had an injection.   here for pap smear, follow up on fibroids and 2nd Lupron injection.  Planning on hysterectomy for uterine fibroids.  Decline uterine artery embolization.  Declines future childbearing.   Jehovah's witness.  Has her papers with her today which document this.  YQM:VHQIONGE  GYNECOLOGIC HISTORY: No LMP recorded. Patient has had an injection. Contraception: Condoms Menopausal hormone therapy:  n/a Last mammogram:11-22-17Neg, BiRads1 with [facility]  Last pap smear:04-27-13 Neg:Neg HR HPV--patient thinks she had pap 05-01-16 which was normal. There is no documentation in her chart that this was done in 2017 upon further review after her office visit was complete today.         OB History    Gravida Para Term Preterm AB Living   0 0 0 0 0 0   SAB TAB Ectopic Multiple Live Births   0 0 0 0 0         There are no active problems to display for this patient.   Past Medical History:  Diagnosis Date  . Fibroids   . Patient is Jehovah's Witness     Past Surgical History:  Procedure Laterality Date  . BREAST SURGERY     breast reduction  . IR GENERIC HISTORICAL  05/07/2016   IR RADIOLOGIST EVAL & MGMT 05/07/2016 Greggory Keen, MD [facility] INTERV RAD  . MYOMECTOMY  11/29/2009   Dr.Natayla Cadenhead Quincy Simmonds    Current Outpatient Prescriptions  Medication Sig Dispense Refill  . leuprolide (LUPRON) 11.25 MG injection Inject 11.25 mg into the muscle every 3 (three) months.     No current facility-administered medications for this visit.      ALLERGIES: Patient has no known allergies.  Family History  Problem Relation Age of Onset  . Diabetes Mother   . Thyroid disease Mother     hyperthyroid  . Diabetes Father   . Hypertension Father   . Hypertension Sister   . Breast cancer Maternal Aunt 69    A & W  . Stroke Paternal Grandmother   .  Hypertension Sister     Social History   Social History  . Marital status: Married    Spouse name: N/A  . Number of children: N/A  . Years of education: N/A   Occupational History  . Not on file.   Social History Main Topics  . Smoking status: Never Smoker  . Smokeless tobacco: Never Used  . Alcohol use 2.4 oz/week    2 Glasses of wine, 2 Shots of liquor per week     Comment: Occasional    . Drug use: No  . Sexual activity: Yes    Partners: Male    Birth control/ protection: Pill     Comment: Loestrin   Other Topics Concern  . Not on file   Social History Narrative  . No narrative on file    ROS:  Pertinent items are noted in HPI.  PHYSICAL EXAMINATION:    BP 130/68 (BP Location: Right Arm, Patient Position: Sitting, Cuff Size: Normal)   Pulse 80   Ht 5\' 6"  (1.676 m)   Wt 202 lb 12.8 oz (92 kg)   BMI 32.73 kg/m     General appearance: alert, cooperative and appears stated age   Abdomen: Pfannensteil incision, soft, uterus to umbilicus, nontender.   Pelvic: External genitalia:  no lesions  Urethra:  normal appearing urethra with no masses, tenderness or lesions              Bartholins and Skenes: normal                 Vagina: normal appearing vagina with normal color and discharge, no lesions              Cervix: no lesions.  Pap collected.                Bimanual Exam:  Uterus:   20 week size, feels heavy.              Adnexa: no mass, fullness, tenderness              Chaperone was present for exam.  ASSESSMENT  Uterine fibroids.  Status post Depo Lupron 11.25 x 1.   PLAN  Second Depo Lupron 11.25 mg given today.  Pap and HR HPV collected.  Copy of [place] witness papers to be scanned in to [facility]. Will proceed forward with surgical planning.  We are hopeful for a laparoscopic approach, but this depends on her response to this second injection of Depo Lupron which may or may not give significant additional shrinkage. She will return  for a preop visit.    An After Visit Summary was printed and given to the patient.  _15_____ minutes face to face time of which over 50% was spent in counseling.

## 2016-09-18 NOTE — Progress Notes (Signed)
Surgical instruction form reviewed and given to patient. Pre op appointment scheduled for 11/14/2016 at 3 pm with Dr.Silva. 1 week post op scheduled for 12/09/2016 with Dr.Silva. 6 week post op scheduled for 01/15/2017 at 10:30 am with Dr.Silva. Patient is agreeable to all appointment dates and times. Aware she will also have a pre-op appointment with the hospital and will be contacted directed by them to schedule this.

## 2016-09-19 ENCOUNTER — Encounter: Payer: Self-pay | Admitting: Obstetrics and Gynecology

## 2016-09-24 LAB — IPS PAP TEST WITH HPV

## 2016-11-14 ENCOUNTER — Encounter: Payer: Self-pay | Admitting: Obstetrics and Gynecology

## 2016-11-14 ENCOUNTER — Ambulatory Visit (INDEPENDENT_AMBULATORY_CARE_PROVIDER_SITE_OTHER): Payer: 59 | Admitting: Obstetrics and Gynecology

## 2016-11-14 VITALS — BP 120/80 | HR 84 | Resp 16 | Wt 204.0 lb

## 2016-11-14 DIAGNOSIS — D259 Leiomyoma of uterus, unspecified: Secondary | ICD-10-CM

## 2016-11-14 NOTE — Patient Instructions (Signed)
Please call the [facility] for your preop visit: [phone].

## 2016-11-14 NOTE — Progress Notes (Signed)
GYNECOLOGY  VISIT   HPI: 41 y.o.   Married  [place] American  female   G0P0000 with No LMP recorded. Patient has had an injection.   here for surgical consultation.  Husband here for visit today.   Laparoscopic hysterectomy planned for uterine fibroids.    Status post Depo Lupron x 6 month of therapy.  Last injection was 09/19/15.  Having hot flashes.  EMB 05/2016 - Dr. Rogue Bussing - benign endometrial tissue with progesterone effect.  Pelvic US 08/29/16 - information from my ofice note on 322/18 Uterus with fibroids:  3.97 cm, 7.49 cm, 5.04 cm, 2.82 cm, 10.41 cm.  Uterine size 12 x 12 x almost 8 cm.  Endometrium obscured by fibroids.  Ovaries normal.  No free fluid.   Declines future childbearing.  GYNECOLOGIC HISTORY: No LMP recorded. Patient has had an injection. Contraception: condoms Menopausal hormone therapy:  n/a Last mammogram:  11-22-17Neg, BiRads1 with [facility]  Last pap smear:   09/18/16 Pap and HR HPV negative        OB History    Gravida Para Term Preterm AB Living   0 0 0 0 0 0   SAB TAB Ectopic Multiple Live Births   0 0 0 0 0         There are no active problems to display for this patient.   Past Medical History:  Diagnosis Date  . Fibroids   . Patient is Jehovah's Witness     Past Surgical History:  Procedure Laterality Date  . BREAST SURGERY     breast reduction  . IR GENERIC HISTORICAL  05/07/2016   IR RADIOLOGIST EVAL & MGMT 05/07/2016 Greggory Keen, MD [facility] INTERV RAD  . MYOMECTOMY  11/29/2009   Dr.Avanti Jetter [facility] Simmonds  . WISDOM TOOTH EXTRACTION      Current Outpatient Prescriptions  Medication Sig Dispense Refill  . leuprolide (LUPRON) 11.25 MG injection Inject 11.25 mg into the muscle every 3 (three) months.     No current facility-administered medications for this visit.      ALLERGIES: Patient has no known allergies.  Family History  Problem Relation Age of Onset  . Diabetes Mother   . Thyroid disease Mother    hyperthyroid  . Diabetes Father   . Hypertension Father   . Hypertension Sister   . Breast cancer Maternal Aunt 78       A & W  . Stroke Paternal Grandmother   . Hypertension Sister     Social History   Social History  . Marital status: Married    Spouse name: N/A  . Number of children: N/A  . Years of education: N/A   Occupational History  . Not on file.   Social History Main Topics  . Smoking status: Never Smoker  . Smokeless tobacco: Never Used  . Alcohol use 2.4 oz/week    2 Glasses of wine, 2 Shots of liquor per week     Comment: Occasional    . Drug use: No  . Sexual activity: Yes    Partners: Male    Birth control/ protection: Pill     Comment: Loestrin   Other Topics Concern  . Not on file   Social History Narrative  . No narrative on file    ROS:  Pertinent items are noted in HPI.  PHYSICAL EXAMINATION:    BP 120/80 (BP Location: Right Arm, Patient Position: Sitting, Cuff Size: Normal)   Pulse 84   Resp 16   Wt 204  lb (92.5 kg)   BMI 32.93 kg/m     General appearance: alert, cooperative and appears stated age Head: Normocephalic, without obvious abnormality, atraumatic Neck: no adenopathy, supple, symmetrical, trachea midline and thyroid normal to inspection and palpation Lungs: clear to auscultation bilaterally Breasts: normal appearance, no masses or tenderness, No nipple retraction or dimpling, No nipple discharge or bleeding, No axillary or supraclavicular adenopathy Heart: regular rate and rhythm Abdomen: soft, non-tender, no masses,  no organomegaly Extremities: extremities normal, atraumatic, no cyanosis or edema Skin: Skin color, texture, turgor normal. No rashes or lesions Lymph nodes: Cervical, supraclavicular, and axillary nodes normal. No abnormal inguinal nodes palpated Neurologic: Grossly normal  Pelvic: External genitalia:  no lesions              Urethra:  normal appearing urethra with no masses, tenderness or lesions               Bartholins and Skenes: normal                 Vagina: normal appearing vagina with normal color and discharge, no lesions              Cervix: no lesions                Bimanual Exam:  Uterus:   20 week size, nontender.  Does fill the pelvis.               Adnexa: no mass, fullness, tenderness              Rectal exam: Yes.  .  Confirms.              Anus:  normal sphincter tone, no lesions  Chaperone was present for exam.  ASSESSMENT  Uterine fibroids.  Status post myomectomy.  Status post Depo Lupron x 5 months.  Jehovah's witness.  PLAN  We reviewed total laparoscopic hysterectomy with bilateral salpingectomy, cystoscopy, and vaginal morcellation of uterus in the Alexis bag versus total abdominal hysterectomy with bilateral salpingectomy.   Risks, benefits, and alternatives have been reviewed.  Risks include but are not limited to bleeding, infection, damage to surrounding organs, conversion to laparotomy if the procedure is begun laparoscopically, reaction to anesthesia, pneumonia, DVT, PE, death, and finding of a hidden sarcoma. I will plan to examine her under anesthesia and potentially place a laparoscope to make the final decision regarding laparoscopic versus abdominal approach.  The patient is requesting to avoid a vertical midline incision, so we discussed a Maylard incision, likely above her previous Pfannenstiel incision.  She understands that muscle transection is involved in this type of incision and is ok with this plan.  [facility] reviewed the patient indicating her desires to avoid transfusion with red blood cells.  See the document that has been scanned into EPIC.  She will do a magnesium citrate bowel prep.  Will receive Lovenox in preop hold. Abx prophylaxis discussed.  Surgical expectations and recovery reviewed.  Patient wishes to proceed.   An After Visit Summary was printed and given to the patient.  _25_____ minutes face to face time  of which over 50% was spent in counseling.

## 2016-11-20 ENCOUNTER — Other Ambulatory Visit: Payer: Self-pay | Admitting: *Deleted

## 2016-11-20 NOTE — Telephone Encounter (Signed)
Faxed refill request received from [facility] for Lupron. RX is denied per Lamont Snowball, RN.  Patient will be having surgery on 12/02/16.

## 2016-11-21 ENCOUNTER — Telehealth: Payer: Self-pay | Admitting: Obstetrics and Gynecology

## 2016-11-21 NOTE — Telephone Encounter (Signed)
[employer] forms forwarded to Dr Quincy Simmonds for review.   cc: Dr Quincy Simmonds

## 2016-11-22 NOTE — Telephone Encounter (Signed)
Form signed by me.

## 2016-11-22 NOTE — Patient Instructions (Addendum)
Your procedure is scheduled on:  Monday, June 25  Enter through the Main Entrance of [facility] at: 6 am  Pick up the phone at the desk and dial [phone].  Call this number if you have problems the morning of surgery: [phone].  Remember: Do NOT eat food or drink (including water) after Sunday.  Take these medicines the morning of surgery with a SIP OF WATER:  zantac  Do Not smoke on the day of surgery.  Stop herbal medications and supplements at this time.  Do NOT wear jewelry (body piercing), metal hair clips/bobby pins, make-up, or nail polish. Do NOT wear lotions, powders, or perfumes.  You may wear deoderant. Do NOT shave for 48 hours prior to surgery. Do NOT bring valuables to the hospital. Contacts may not be worn into surgery.  Leave suitcase in car.  After surgery it may be brought to your room.  For patients admitted to the hospital, checkout time is 11:00 AM the day of discharge. Have a responsible adult drive you home and stay with you for 24 hours after your procedure.  Home with husband Juanda Crumble cell [phone]

## 2016-11-25 ENCOUNTER — Encounter (HOSPITAL_COMMUNITY)
Admission: RE | Admit: 2016-11-25 | Discharge: 2016-11-25 | Disposition: A | Discharge status: Home / Self Care | Payer: 59 | Source: Ambulatory Visit | Attending: Obstetrics and Gynecology | Admitting: Obstetrics and Gynecology

## 2016-11-25 ENCOUNTER — Encounter (HOSPITAL_COMMUNITY): Payer: Self-pay

## 2016-11-25 DIAGNOSIS — Z01812 Encounter for preprocedural laboratory examination: Secondary | ICD-10-CM | POA: Diagnosis present

## 2016-11-25 DIAGNOSIS — D259 Leiomyoma of uterus, unspecified: Secondary | ICD-10-CM | POA: Diagnosis not present

## 2016-11-25 HISTORY — DX: Other seasonal allergic rhinitis: J30.2

## 2016-11-25 HISTORY — DX: Constipation, unspecified: K59.00

## 2016-11-25 HISTORY — DX: Gastro-esophageal reflux disease without esophagitis: K21.9

## 2016-11-25 LAB — COMPREHENSIVE METABOLIC PANEL
ALT: 16 U/L (ref 14–54)
ANION GAP: 5 (ref 5–15)
AST: 20 U/L (ref 15–41)
Albumin: 4.6 g/dL (ref 3.5–5.0)
Alkaline Phosphatase: 55 U/L (ref 38–126)
BILIRUBIN TOTAL: 0.9 mg/dL (ref 0.3–1.2)
BUN: 16 mg/dL (ref 6–20)
CHLORIDE: 103 mmol/L (ref 101–111)
CO2: 29 mmol/L (ref 22–32)
Calcium: 9.7 mg/dL (ref 8.9–10.3)
Creatinine, Ser: 0.92 mg/dL (ref 0.44–1.00)
Glucose, Bld: 100 mg/dL — ABNORMAL HIGH (ref 65–99)
POTASSIUM: 3.9 mmol/L (ref 3.5–5.1)
Sodium: 137 mmol/L (ref 135–145)
TOTAL PROTEIN: 7.7 g/dL (ref 6.5–8.1)

## 2016-11-25 LAB — CBC
HEMATOCRIT: 37.5 % (ref 36.0–46.0)
Hemoglobin: 12.8 g/dL (ref 12.0–15.0)
MCH: 31.8 pg (ref 26.0–34.0)
MCHC: 34.1 g/dL (ref 30.0–36.0)
MCV: 93.3 fL (ref 78.0–100.0)
PLATELETS: 255 10*3/uL (ref 150–400)
RBC: 4.02 MIL/uL (ref 3.87–5.11)
RDW: 12.7 % (ref 11.5–15.5)
WBC: 5.8 10*3/uL (ref 4.0–10.5)

## 2016-11-25 NOTE — Telephone Encounter (Signed)
Completed [employer] forms faxed to employer, Andreas Newport with [employer] today. Fax confirmation received indicating transmittal was successful.   cc: Dr Quincy Simmonds

## 2016-12-01 ENCOUNTER — Encounter (HOSPITAL_COMMUNITY): Payer: Self-pay | Admitting: Anesthesiology

## 2016-12-01 NOTE — H&P (Signed)
Office Visit   11/14/2016 [facility]  Yisroel Ramming, Everardo All, MD  Obstetrics and Gynecology   Uterine leiomyoma, unspecified location  Dx   Office Visit   Reason for Visit   Additional Documentation   Vitals:   BP 120/80 (BP Location: Right Arm, Patient Position: Sitting, Cuff Size: Normal)   Pulse 84   Resp 16   Wt 204 lb (92.5 kg)   BMI 32.93 kg/m   BSA 2.08 m   Flowsheets:   Infectious Disease Screening,   Custom Formula Data,   MEWS Score,   Anthropometrics     Encounter Info:   Billing Info,   History,   Allergies,   Detailed Report     All Notes   Progress Notes by Nunzio Cobbs, MD at 11/14/2016 3:00 PM   Author: Nunzio Cobbs, MD Author Type: Physician Filed: 11/16/2016 1:50 PM  Note Status: Signed Cosign: Cosign Not Required Encounter Date: 11/14/2016  Editor: Nunzio Cobbs, MD (Physician)  Prior Versions: 1. Archie Balboa CMA (Certified Psychologist, sport and exercise) at 11/14/2016 3:13 PM - Sign at close encounter    GYNECOLOGY  VISIT   HPI: 41 y.o.   Married  [place] American  female   G0P0000 with No LMP recorded. Patient has had an injection.   here for surgical consultation.  Husband here for visit today.   Laparoscopic hysterectomy planned for uterine fibroids.    Status post Depo Lupron x 6 month of therapy.  Last injection was 09/19/15.  Having hot flashes.  EMB 05/2016 - Dr. Rogue Bussing - benign endometrial tissue with progesterone effect.  Pelvic US 08/29/16 - information from my ofice note on 322/18 Uterus with fibroids: 3.97 cm, 7.49 cm, 5.04 cm, 2.82 cm, 10.41 cm.  Uterine size 12 x 12 x almost 8 cm.  Endometrium obscured by fibroids.  Ovaries normal.  No free fluid.   Declines future childbearing.  GYNECOLOGIC HISTORY: No LMP recorded. Patient has had an injection. Contraception: condoms Menopausal hormone therapy:  n/a Last mammogram:  11-22-17Neg, BiRads1 with [facility]  [facility] Last pap smear:   09/18/16 Pap and HR HPV negative                OB History    Gravida Para Term Preterm AB Living   0 0 0 0 0 0   SAB TAB Ectopic Multiple Live Births   0 0 0 0 0         There are no active problems to display for this patient.       Past Medical History:  Diagnosis Date  . Fibroids   . Patient is Jehovah's Witness          Past Surgical History:  Procedure Laterality Date  . BREAST SURGERY     breast reduction  . IR GENERIC HISTORICAL  05/07/2016   IR RADIOLOGIST EVAL & MGMT 05/07/2016 Greggory Keen, MD [facility] INTERV RAD  . MYOMECTOMY  11/29/2009   Dr.Raven Furnas Quincy Simmonds  . WISDOM TOOTH EXTRACTION            Current Outpatient Prescriptions  Medication Sig Dispense Refill  . leuprolide (LUPRON) 11.25 MG injection Inject 11.25 mg into the muscle every 3 (three) months.     No current facility-administered medications for this visit.      ALLERGIES: Patient has no known allergies.       Family History  Problem Relation Age of Onset  .  Diabetes Mother   . Thyroid disease Mother        hyperthyroid  . Diabetes Father   . Hypertension Father   . Hypertension Sister   . Breast cancer Maternal Aunt 42       A & W  . Stroke Paternal Grandmother   . Hypertension Sister     Social History        Social History  . Marital status: Married    Spouse name: N/A  . Number of children: N/A  . Years of education: N/A      Occupational History  . Not on file.         Social History Main Topics  . Smoking status: Never Smoker  . Smokeless tobacco: Never Used  . Alcohol use 2.4 oz/week    2 Glasses of wine, 2 Shots of liquor per week     Comment: Occasional    . Drug use: No  . Sexual activity: Yes    Partners: Male    Birth control/ protection: Pill     Comment: Loestrin       Other Topics Concern  . Not on file      Social History Narrative  . No narrative on file     ROS:  Pertinent items are noted in HPI.  PHYSICAL EXAMINATION:    BP 120/80 (BP Location: Right Arm, Patient Position: Sitting, Cuff Size: Normal)   Pulse 84   Resp 16   Wt 204 lb (92.5 kg)   BMI 32.93 kg/m     General appearance: alert, cooperative and appears stated age Head: Normocephalic, without obvious abnormality, atraumatic Neck: no adenopathy, supple, symmetrical, trachea midline and thyroid normal to inspection and palpation Lungs: clear to auscultation bilaterally Breasts: normal appearance, no masses or tenderness, No nipple retraction or dimpling, No nipple discharge or bleeding, No axillary or supraclavicular adenopathy Heart: regular rate and rhythm Abdomen: soft, non-tender, no masses,  no organomegaly Extremities: extremities normal, atraumatic, no cyanosis or edema Skin: Skin color, texture, turgor normal. No rashes or lesions Lymph nodes: Cervical, supraclavicular, and axillary nodes normal. No abnormal inguinal nodes palpated Neurologic: Grossly normal  Pelvic: External genitalia:  no lesions              Urethra:  normal appearing urethra with no masses, tenderness or lesions              Bartholins and Skenes: normal                 Vagina: normal appearing vagina with normal color and discharge, no lesions              Cervix: no lesions                Bimanual Exam:  Uterus:   20 week size, nontender.  Does fill the pelvis.               Adnexa: no mass, fullness, tenderness              Rectal exam: Yes.  .  Confirms.              Anus:  normal sphincter tone, no lesions  Chaperone was present for exam.  ASSESSMENT  Uterine fibroids.  Status post myomectomy.  Status post Depo Lupron x 5 months.  Jehovah's witness.  PLAN  We reviewed total laparoscopic hysterectomy with bilateral salpingectomy, cystoscopy, and vaginal morcellation of uterus in the Alexis bag versus total abdominal  hysterectomy with bilateral salpingectomy.   Risks,  benefits, and alternatives have been reviewed.  Risks include but are not limited to bleeding, infection, damage to surrounding organs, conversion to laparotomy if the procedure is begun laparoscopically, reaction to anesthesia, pneumonia, DVT, PE, death, and finding of a hidden sarcoma. I will plan to examine her under anesthesia and potentially place a laparoscope to make the final decision regarding laparoscopic versus abdominal approach.  The patient is requesting to avoid a vertical midline incision, so we discussed a Maylard incision, likely above her previous Pfannenstiel incision.  She understands that muscle transection is involved in this type of incision and is ok with this plan.  [facility] reviewed the patient indicating her desires to avoid transfusion with red blood cells.  See the document that has been scanned into EPIC.  She will do a magnesium citrate bowel prep.  Will receive Lovenox in preop hold. Abx prophylaxis discussed.  Surgical expectations and recovery reviewed.  Patient wishes to proceed.   An After Visit Summary was printed and given to the patient.  _25_____ minutes face to face time of which over 50% was spent in counseling.

## 2016-12-01 NOTE — Anesthesia Preprocedure Evaluation (Addendum)
Anesthesia Evaluation  Patient identified by MRN, date of birth, ID band Patient awake    Reviewed: Allergy & Precautions, NPO status , Patient's Chart, lab work & pertinent test results  History of Anesthesia Complications Negative for: history of anesthetic complications  Airway Mallampati: II  TM Distance: >3 FB Neck ROM: Full    Dental  (+) Dental Advisory Given   Pulmonary neg pulmonary ROS,    breath sounds clear to auscultation       Cardiovascular (-) anginanegative cardio ROS   Rhythm:Regular Rate:Normal     Neuro/Psych negative neurological ROS     GI/Hepatic Neg liver ROS, GERD  Medicated and Controlled,  Endo/Other  Morbid obesity  Renal/GU negative Renal ROS     Musculoskeletal   Abdominal (+) + obese,   Peds  Hematology  (+) JEHOVAH'S WITNESS (pt accepts albumin and clotting factors other than platelets)  Anesthesia Other Findings   Reproductive/Obstetrics                           Anesthesia Physical Anesthesia Plan  ASA: II  Anesthesia Plan: General   Post-op Pain Management:    Induction:   PONV Risk Score and Plan: 4 or greater and Ondansetron, Dexamethasone, Propofol, Midazolam and Scopolamine patch - Pre-op  Airway Management Planned: Oral ETT  Additional Equipment:   Intra-op Plan:   Post-operative Plan: Extubation in OR  Informed Consent: I have reviewed the patients History and Physical, chart, labs and discussed the procedure including the risks, benefits and alternatives for the proposed anesthesia with the patient or authorized representative who has indicated his/her understanding and acceptance.   Dental advisory given  Plan Discussed with: CRNA and Surgeon  Anesthesia Plan Comments: (Plan routine monitors, GETA)       Anesthesia Quick Evaluation

## 2016-12-02 ENCOUNTER — Inpatient Hospital Stay (HOSPITAL_COMMUNITY)
Admission: AD | Admit: 2016-12-02 | Discharge: 2016-12-04 | DRG: 742 | Disposition: A | Discharge status: Home / Self Care | Payer: 59 | Source: Ambulatory Visit | Attending: Obstetrics and Gynecology | Admitting: Obstetrics and Gynecology

## 2016-12-02 ENCOUNTER — Ambulatory Visit (HOSPITAL_COMMUNITY): Payer: 59 | Admitting: Anesthesiology

## 2016-12-02 ENCOUNTER — Encounter (HOSPITAL_COMMUNITY): Payer: Self-pay

## 2016-12-02 ENCOUNTER — Encounter (HOSPITAL_COMMUNITY)
Admission: AD | Disposition: A | Discharge status: Home / Self Care | Payer: Self-pay | Source: Ambulatory Visit | Attending: Obstetrics and Gynecology

## 2016-12-02 DIAGNOSIS — Z90710 Acquired absence of both cervix and uterus: Secondary | ICD-10-CM | POA: Diagnosis present

## 2016-12-02 DIAGNOSIS — R200 Anesthesia of skin: Secondary | ICD-10-CM | POA: Diagnosis not present

## 2016-12-02 DIAGNOSIS — R739 Hyperglycemia, unspecified: Secondary | ICD-10-CM | POA: Diagnosis not present

## 2016-12-02 DIAGNOSIS — D251 Intramural leiomyoma of uterus: Secondary | ICD-10-CM

## 2016-12-02 DIAGNOSIS — D259 Leiomyoma of uterus, unspecified: Principal | ICD-10-CM | POA: Diagnosis present

## 2016-12-02 DIAGNOSIS — Z6832 Body mass index (BMI) 32.0-32.9, adult: Secondary | ICD-10-CM

## 2016-12-02 DIAGNOSIS — J9811 Atelectasis: Secondary | ICD-10-CM | POA: Diagnosis not present

## 2016-12-02 DIAGNOSIS — N920 Excessive and frequent menstruation with regular cycle: Secondary | ICD-10-CM

## 2016-12-02 DIAGNOSIS — K219 Gastro-esophageal reflux disease without esophagitis: Secondary | ICD-10-CM | POA: Diagnosis present

## 2016-12-02 HISTORY — PX: CYSTOSCOPY: SHX5120

## 2016-12-02 HISTORY — PX: HYSTERECTOMY ABDOMINAL WITH SALPINGECTOMY: SHX6725

## 2016-12-02 LAB — PREGNANCY, URINE: PREG TEST UR: NEGATIVE

## 2016-12-02 SURGERY — HYSTERECTOMY, TOTAL, ABDOMINAL, WITH SALPINGECTOMY
Anesthesia: General | Site: Bladder

## 2016-12-02 MED ORDER — DEXMEDETOMIDINE HCL IN NACL 200 MCG/50ML IV SOLN
INTRAVENOUS | Status: AC
  Filled 2016-12-02: qty 50

## 2016-12-02 MED ORDER — PROMETHAZINE HCL 25 MG/ML IJ SOLN: 6.2500 mg | INTRAMUSCULAR | Status: DC | PRN

## 2016-12-02 MED ORDER — DEXMEDETOMIDINE HCL IN NACL 200 MCG/50ML IV SOLN
INTRAVENOUS | Status: DC | PRN
  Administered 2016-12-02: 20 ug via INTRAVENOUS

## 2016-12-02 MED ORDER — HYDROMORPHONE HCL 1 MG/ML IJ SOLN
INTRAMUSCULAR | Status: AC
  Administered 2016-12-02: 0.5 mg via INTRAVENOUS
  Filled 2016-12-02: qty 1

## 2016-12-02 MED ORDER — ONDANSETRON HCL 4 MG/2ML IJ SOLN
INTRAMUSCULAR | Status: AC
  Filled 2016-12-02: qty 2

## 2016-12-02 MED ORDER — ROCURONIUM BROMIDE 100 MG/10ML IV SOLN
INTRAVENOUS | Status: AC
  Filled 2016-12-02: qty 1

## 2016-12-02 MED ORDER — KETOROLAC TROMETHAMINE 30 MG/ML IJ SOLN
INTRAMUSCULAR | Status: AC
  Administered 2016-12-02: 30 mg via INTRAVENOUS
  Filled 2016-12-02: qty 1

## 2016-12-02 MED ORDER — LIDOCAINE HCL (CARDIAC) 20 MG/ML IV SOLN
INTRAVENOUS | Status: DC | PRN
  Administered 2016-12-02: 80 mg via INTRAVENOUS

## 2016-12-02 MED ORDER — SODIUM CHLORIDE 0.9 % IJ SOLN
INTRAMUSCULAR | Status: AC
  Filled 2016-12-02: qty 50

## 2016-12-02 MED ORDER — MIDAZOLAM HCL 2 MG/2ML IJ SOLN
INTRAMUSCULAR | Status: DC | PRN
  Administered 2016-12-02: 2 mg via INTRAVENOUS

## 2016-12-02 MED ORDER — PROPOFOL 10 MG/ML IV BOLUS
INTRAVENOUS | Status: DC | PRN
  Administered 2016-12-02: 30 mg via INTRAVENOUS
  Administered 2016-12-02: 170 mg via INTRAVENOUS

## 2016-12-02 MED ORDER — DEXAMETHASONE SODIUM PHOSPHATE 4 MG/ML IJ SOLN
INTRAMUSCULAR | Status: AC
  Filled 2016-12-02: qty 1

## 2016-12-02 MED ORDER — BUPIVACAINE HCL (PF) 0.25 % IJ SOLN
INTRAMUSCULAR | Status: AC
  Filled 2016-12-02: qty 30

## 2016-12-02 MED ORDER — ROPIVACAINE HCL 5 MG/ML IJ SOLN
INTRAMUSCULAR | Status: AC
  Filled 2016-12-02: qty 30

## 2016-12-02 MED ORDER — MIDAZOLAM HCL 2 MG/2ML IJ SOLN
INTRAMUSCULAR | Status: AC
  Filled 2016-12-02: qty 2

## 2016-12-02 MED ORDER — DIPHENHYDRAMINE HCL 12.5 MG/5ML PO ELIX: 12.5000 mg | ORAL_SOLUTION | Freq: Four times a day (QID) | ORAL | Status: DC | PRN

## 2016-12-02 MED ORDER — FENTANYL CITRATE (PF) 250 MCG/5ML IJ SOLN
INTRAMUSCULAR | Status: AC
  Filled 2016-12-02: qty 5

## 2016-12-02 MED ORDER — HYDROMORPHONE 1 MG/ML IV SOLN
INTRAVENOUS | Status: DC
  Administered 2016-12-02: 1.8 mg via INTRAVENOUS
  Administered 2016-12-02: 1 mL via INTRAVENOUS
  Administered 2016-12-03 (×2): 0.4 mg via INTRAVENOUS
  Filled 2016-12-02: qty 25

## 2016-12-02 MED ORDER — LACTATED RINGERS IV SOLN
INTRAVENOUS | Status: DC
  Administered 2016-12-02: 12:00:00 via INTRAVENOUS

## 2016-12-02 MED ORDER — LACTATED RINGERS IV SOLN
INTRAVENOUS | Status: DC
  Administered 2016-12-02: 125 mL/h via INTRAVENOUS
  Administered 2016-12-02 (×2): via INTRAVENOUS

## 2016-12-02 MED ORDER — FAMOTIDINE IN NACL 20-0.9 MG/50ML-% IV SOLN
20.0000 mg | Freq: Two times a day (BID) | INTRAVENOUS | Status: DC
  Administered 2016-12-02: 20 mg via INTRAVENOUS
  Filled 2016-12-02 (×2): qty 50

## 2016-12-02 MED ORDER — FENTANYL CITRATE (PF) 100 MCG/2ML IJ SOLN
INTRAMUSCULAR | Status: DC | PRN
  Administered 2016-12-02 (×5): 50 ug via INTRAVENOUS
  Administered 2016-12-02: 100 ug via INTRAVENOUS
  Administered 2016-12-02: 50 ug via INTRAVENOUS

## 2016-12-02 MED ORDER — LACTATED RINGERS IV SOLN
INTRAVENOUS | Status: DC
  Administered 2016-12-02 – 2016-12-03 (×2): via INTRAVENOUS

## 2016-12-02 MED ORDER — ONDANSETRON HCL 4 MG/2ML IJ SOLN
4.0000 mg | Freq: Four times a day (QID) | INTRAMUSCULAR | Status: DC | PRN
  Administered 2016-12-03 (×2): 4 mg via INTRAVENOUS
  Filled 2016-12-02 (×2): qty 2

## 2016-12-02 MED ORDER — ARTIFICIAL TEARS OPHTHALMIC OINT
TOPICAL_OINTMENT | OPHTHALMIC | Status: DC | PRN
  Administered 2016-12-02: 1 via OPHTHALMIC

## 2016-12-02 MED ORDER — NALOXONE HCL 0.4 MG/ML IJ SOLN: 0.4000 mg | INTRAMUSCULAR | Status: DC | PRN

## 2016-12-02 MED ORDER — SODIUM CHLORIDE 0.9% FLUSH: 9.0000 mL | INTRAVENOUS | Status: DC | PRN

## 2016-12-02 MED ORDER — DIPHENHYDRAMINE HCL 50 MG/ML IJ SOLN: 12.5000 mg | Freq: Four times a day (QID) | INTRAMUSCULAR | Status: DC | PRN

## 2016-12-02 MED ORDER — KETOROLAC TROMETHAMINE 30 MG/ML IJ SOLN
30.0000 mg | Freq: Four times a day (QID) | INTRAMUSCULAR | Status: DC
  Administered 2016-12-02 – 2016-12-03 (×4): 30 mg via INTRAVENOUS
  Filled 2016-12-02 (×3): qty 1

## 2016-12-02 MED ORDER — DEXAMETHASONE SODIUM PHOSPHATE 4 MG/ML IJ SOLN
INTRAMUSCULAR | Status: DC | PRN
  Administered 2016-12-02: 4 mg via INTRAVENOUS

## 2016-12-02 MED ORDER — CEFOTETAN DISODIUM-DEXTROSE 2-2.08 GM-% IV SOLR
INTRAVENOUS | Status: AC
  Filled 2016-12-02: qty 50

## 2016-12-02 MED ORDER — PROPOFOL 10 MG/ML IV BOLUS
INTRAVENOUS | Status: AC
  Filled 2016-12-02: qty 20

## 2016-12-02 MED ORDER — HYDROMORPHONE HCL 1 MG/ML IJ SOLN
0.2500 mg | INTRAMUSCULAR | Status: DC | PRN
  Administered 2016-12-02 (×2): 0.5 mg via INTRAVENOUS

## 2016-12-02 MED ORDER — ARTIFICIAL TEARS OPHTHALMIC OINT
TOPICAL_OINTMENT | OPHTHALMIC | Status: AC
  Filled 2016-12-02: qty 3.5

## 2016-12-02 MED ORDER — SCOPOLAMINE 1 MG/3DAYS TD PT72
MEDICATED_PATCH | TRANSDERMAL | Status: AC
  Administered 2016-12-02: 1.5 mg via TRANSDERMAL
  Filled 2016-12-02: qty 1

## 2016-12-02 MED ORDER — OXYCODONE-ACETAMINOPHEN 5-325 MG PO TABS
1.0000 | ORAL_TABLET | ORAL | Status: DC | PRN
  Administered 2016-12-03 – 2016-12-04 (×4): 2 via ORAL
  Filled 2016-12-02 (×4): qty 2

## 2016-12-02 MED ORDER — CEFOTETAN DISODIUM-DEXTROSE 2-2.08 GM-% IV SOLR
2.0000 g | INTRAVENOUS | Status: AC
  Administered 2016-12-02: 2 g via INTRAVENOUS

## 2016-12-02 MED ORDER — STERILE WATER FOR IRRIGATION IR SOLN
Status: DC | PRN
  Administered 2016-12-02: 1000 mL

## 2016-12-02 MED ORDER — SUGAMMADEX SODIUM 200 MG/2ML IV SOLN
INTRAVENOUS | Status: DC | PRN
  Administered 2016-12-02: 190 mg via INTRAVENOUS

## 2016-12-02 MED ORDER — MIDAZOLAM HCL 2 MG/2ML IJ SOLN: 0.5000 mg | Freq: Once | INTRAMUSCULAR | Status: DC | PRN

## 2016-12-02 MED ORDER — LIDOCAINE HCL (CARDIAC) 20 MG/ML IV SOLN
INTRAVENOUS | Status: AC
  Filled 2016-12-02: qty 5

## 2016-12-02 MED ORDER — IBUPROFEN 600 MG PO TABS
600.0000 mg | ORAL_TABLET | Freq: Four times a day (QID) | ORAL | Status: DC | PRN
  Administered 2016-12-03: 600 mg via ORAL
  Filled 2016-12-02: qty 1

## 2016-12-02 MED ORDER — MENTHOL 3 MG MT LOZG: 1.0000 | LOZENGE | OROMUCOSAL | Status: DC | PRN

## 2016-12-02 MED ORDER — SUGAMMADEX SODIUM 200 MG/2ML IV SOLN
INTRAVENOUS | Status: AC
  Filled 2016-12-02: qty 2

## 2016-12-02 MED ORDER — ENOXAPARIN SODIUM 40 MG/0.4ML ~~LOC~~ SOLN
40.0000 mg | SUBCUTANEOUS | Status: AC
  Administered 2016-12-02: 40 mg via SUBCUTANEOUS
  Filled 2016-12-02: qty 0.4

## 2016-12-02 MED ORDER — ROCURONIUM BROMIDE 100 MG/10ML IV SOLN
INTRAVENOUS | Status: DC | PRN
  Administered 2016-12-02: 60 mg via INTRAVENOUS
  Administered 2016-12-02 (×2): 20 mg via INTRAVENOUS

## 2016-12-02 MED ORDER — KETOROLAC TROMETHAMINE 30 MG/ML IJ SOLN
INTRAMUSCULAR | Status: AC
  Filled 2016-12-02: qty 1

## 2016-12-02 MED ORDER — ONDANSETRON HCL 4 MG PO TABS
4.0000 mg | ORAL_TABLET | Freq: Four times a day (QID) | ORAL | Status: DC | PRN
  Administered 2016-12-02: 4 mg via ORAL
  Filled 2016-12-02: qty 1

## 2016-12-02 MED ORDER — ONDANSETRON HCL 4 MG/2ML IJ SOLN
INTRAMUSCULAR | Status: DC | PRN
  Administered 2016-12-02: 4 mg via INTRAVENOUS

## 2016-12-02 MED ORDER — SCOPOLAMINE 1 MG/3DAYS TD PT72
1.0000 | MEDICATED_PATCH | Freq: Once | TRANSDERMAL | Status: DC
  Administered 2016-12-02: 1.5 mg via TRANSDERMAL

## 2016-12-02 MED ORDER — MEPERIDINE HCL 25 MG/ML IJ SOLN: 6.2500 mg | INTRAMUSCULAR | Status: DC | PRN

## 2016-12-02 SURGICAL SUPPLY — 85 items
ADH SKN CLS APL DERMABOND .7 (GAUZE/BANDAGES/DRESSINGS) ×3
APL SRG 38 LTWT LNG FL B (MISCELLANEOUS) ×3
APPLICATOR ARISTA FLEXITIP XL (MISCELLANEOUS) ×3 IMPLANT
BARRIER ADHS 3X4 INTERCEED (GAUZE/BANDAGES/DRESSINGS) IMPLANT
BRR ADH 4X3 ABS CNTRL BYND (GAUZE/BANDAGES/DRESSINGS)
CABLE HIGH FREQUENCY MONO STRZ (ELECTRODE) ×2 IMPLANT
CANISTER SUCT 3000ML PPV (MISCELLANEOUS) ×5 IMPLANT
CATH FOLEY 3WAY  5CC 16FR (CATHETERS)
CATH FOLEY 3WAY 5CC 16FR (CATHETERS) IMPLANT
CELL SAVER LIPIGURD (MISCELLANEOUS) IMPLANT
CELLS DAT CNTRL 66122 CELL SVR (MISCELLANEOUS) IMPLANT
CLOTH BEACON ORANGE TIMEOUT ST (SAFETY) ×5 IMPLANT
COVER MAYO STAND STRL (DRAPES) ×5 IMPLANT
COVER TABLE BACK 60X90 (DRAPES) IMPLANT
DECANTER SPIKE VIAL GLASS SM (MISCELLANEOUS) ×10 IMPLANT
DERMABOND ADVANCED (GAUZE/BANDAGES/DRESSINGS) ×2
DERMABOND ADVANCED .7 DNX12 (GAUZE/BANDAGES/DRESSINGS) ×3 IMPLANT
DEVICE RETRIEVAL ALEXIS 14 (MISCELLANEOUS) IMPLANT
DRAPE WARM FLUID 44X44 (DRAPE) ×3 IMPLANT
DRSG OPSITE POSTOP 4X10 (GAUZE/BANDAGES/DRESSINGS) ×5 IMPLANT
DURAPREP 26ML APPLICATOR (WOUND CARE) ×5 IMPLANT
EXTRT SYSTEM ALEXIS 14CM (MISCELLANEOUS)
EXTRT SYSTEM ALEXIS 17CM (MISCELLANEOUS)
GAUZE SPONGE 4X4 16PLY XRAY LF (GAUZE/BANDAGES/DRESSINGS) ×3 IMPLANT
GLOVE BIO SURGEON STRL SZ 6.5 (GLOVE) ×8 IMPLANT
GLOVE BIO SURGEONS STRL SZ 6.5 (GLOVE) ×2
GLOVE BIOGEL PI IND STRL 7.0 (GLOVE) ×9 IMPLANT
GLOVE BIOGEL PI INDICATOR 7.0 (GLOVE) ×6
GOWN STRL REUS W/TWL LRG LVL3 (GOWN DISPOSABLE) ×20 IMPLANT
HEMOSTAT ARISTA ABSORB 3G PWDR (MISCELLANEOUS) ×3 IMPLANT
LIGASURE VESSEL 5MM BLUNT TIP (ELECTROSURGICAL) ×2 IMPLANT
NEEDLE HYPO 22GX1.5 SAFETY (NEEDLE) IMPLANT
NEEDLE INSUFFLATION 120MM (ENDOMECHANICALS) ×5 IMPLANT
NS IRRIG 1000ML POUR BTL (IV SOLUTION) ×5 IMPLANT
OCCLUDER COLPOPNEUMO (BALLOONS) ×5 IMPLANT
PACK ABDOMINAL GYN (CUSTOM PROCEDURE TRAY) ×5 IMPLANT
PACK LAPAROSCOPY BASIN (CUSTOM PROCEDURE TRAY) ×5 IMPLANT
PACK TRENDGUARD 450 HYBRID PRO (MISCELLANEOUS) IMPLANT
PACK TRENDGUARD 600 HYBRD PROC (MISCELLANEOUS) IMPLANT
PAD OB MATERNITY 4.3X12.25 (PERSONAL CARE ITEMS) ×5 IMPLANT
POUCH LAPAROSCOPIC INSTRUMENT (MISCELLANEOUS) ×5 IMPLANT
PROTECTOR NERVE ULNAR (MISCELLANEOUS) ×10 IMPLANT
RETRACTOR WND ALEXIS 18 MED (MISCELLANEOUS) IMPLANT
RETRACTOR WND ALEXIS 25 LRG (MISCELLANEOUS) IMPLANT
RTRCTR WOUND ALEXIS 18CM MED (MISCELLANEOUS)
RTRCTR WOUND ALEXIS 25CM LRG (MISCELLANEOUS)
SCISSORS LAP 5X35 DISP (ENDOMECHANICALS) ×5 IMPLANT
SET CYSTO W/LG BORE CLAMP LF (SET/KITS/TRAYS/PACK) ×2 IMPLANT
SET IRRIG TUBING LAPAROSCOPIC (IRRIGATION / IRRIGATOR) ×2 IMPLANT
SET TRI-LUMEN FLTR TB AIRSEAL (TUBING) ×5 IMPLANT
SHEET LAVH (DRAPES) ×5 IMPLANT
SLEEVE ADV FIXATION 5X100MM (TROCAR) ×5 IMPLANT
SPONGE LAP 18X18 X RAY DECT (DISPOSABLE) ×10 IMPLANT
SUT PLAIN 2 0 XLH (SUTURE) IMPLANT
SUT VIC AB 0 CT1 18XCR BRD8 (SUTURE) ×6 IMPLANT
SUT VIC AB 0 CT1 27 (SUTURE) ×5
SUT VIC AB 0 CT1 27XBRD ANBCTR (SUTURE) ×7 IMPLANT
SUT VIC AB 0 CT1 8-18 (SUTURE) ×10
SUT VIC AB 2-0 CT1 27 (SUTURE) ×5
SUT VIC AB 2-0 CT1 TAPERPNT 27 (SUTURE) ×3 IMPLANT
SUT VIC AB 4-0 PS2 27 (SUTURE) ×5 IMPLANT
SUT VICRYL 0 TIES 12 18 (SUTURE) ×5 IMPLANT
SUT VICRYL 0 UR6 27IN ABS (SUTURE) IMPLANT
SUT VICRYL 4-0 PS2 18IN ABS (SUTURE) ×2 IMPLANT
SUT VLOC 180 0 9IN  GS21 (SUTURE)
SUT VLOC 180 0 9IN GS21 (SUTURE) IMPLANT
SYR 50ML LL SCALE MARK (SYRINGE) ×10 IMPLANT
SYR CONTROL 10ML LL (SYRINGE) ×3 IMPLANT
SYRINGE 10CC LL (SYRINGE) ×5 IMPLANT
SYSTEM CARTER THOMASON II (TROCAR) IMPLANT
SYSTEM CONTND EXTRCTN KII BLLN (MISCELLANEOUS) IMPLANT
TIP RUMI ORANGE 6.7MMX12CM (TIP) IMPLANT
TIP UTERINE 5.1X6CM LAV DISP (MISCELLANEOUS) IMPLANT
TIP UTERINE 6.7X10CM GRN DISP (MISCELLANEOUS) IMPLANT
TIP UTERINE 6.7X6CM WHT DISP (MISCELLANEOUS) IMPLANT
TIP UTERINE 6.7X8CM BLUE DISP (MISCELLANEOUS) IMPLANT
TOWEL OR 17X24 6PK STRL BLUE (TOWEL DISPOSABLE) ×10 IMPLANT
TRAY FOLEY CATH SILVER 14FR (SET/KITS/TRAYS/PACK) ×2 IMPLANT
TRAY FOLEY CATH SILVER 16FR (SET/KITS/TRAYS/PACK) ×2 IMPLANT
TRENDGUARD 450 HYBRID PRO PACK (MISCELLANEOUS)
TRENDGUARD 600 HYBRID PROC PK (MISCELLANEOUS)
TROCAR ADV FIXATION 5X100MM (TROCAR) ×5 IMPLANT
TROCAR PORT AIRSEAL 8X120 (TROCAR) ×5 IMPLANT
TROCAR XCEL NON-BLD 5MMX100MML (ENDOMECHANICALS) ×5 IMPLANT
WARMER LAPAROSCOPE (MISCELLANEOUS) ×5 IMPLANT

## 2016-12-02 NOTE — Brief Op Note (Signed)
12/02/2016  10:42 AM  PATIENT:  Penny Edwards  41 y.o. female  PRE-OPERATIVE DIAGNOSIS:  menorrhagia, uterine fibroids  POST-OPERATIVE DIAGNOSIS:  menorrhagia, uterine fibroids  PROCEDURE:  TOTAL ABDOMINAL HYSTERECTOMY WITH BILATERAL SALPINGECTOMY, CYSTOSCOPY SURGEON:  Surgeon(s) and Role:    * Amundson Raliegh Ip, MD - Primary    * Megan Salon, MD - Assisting  PHYSICIAN ASSISTANT:  NA  ASSISTANTS:  Felipa Emory, MD   ANESTHESIA:   general  EBL:  Total I/O In: 2200 [I.V.:2200] Out: 800 [Urine:500; Blood:300]  BLOOD ADMINISTERED:none  DRAINS: Urinary Catheter (Foley)   LOCAL MEDICATIONS USED:  NONE  SPECIMEN:  Source of Specimen:  uterus, cervix, bilateral tubes  DISPOSITION OF SPECIMEN:  PATHOLOGY  COUNTS:  YES  TOURNIQUET:  * No tourniquets in log *  DICTATION: .Other Dictation: Dictation Number    PLAN OF CARE: Admit to inpatient   PATIENT DISPOSITION:  PACU - hemodynamically stable.   Delay start of Pharmacological VTE agent (>24hrs) due to surgical blood loss or risk of bleeding: not applicable

## 2016-12-02 NOTE — Progress Notes (Signed)
Update to History and Physical  No marked change in status since office preop visit.   Patient examined.   Ok to proceed.  

## 2016-12-02 NOTE — Progress Notes (Signed)
Day of Surgery Procedure(s) (LRB): HYSTERECTOMY ABDOMINAL WITH SALPINGECTOMY (Bilateral) CYSTOSCOPY (N/A)  Subjective: Patient reports incisional pain.   Dilaudid and Toradol working well. Some right hand numbness.   Moving it well.  States heartburn.  Has reflux.   Objective: I have reviewed patient's vital signs, intake and output and labs. Vitals:   12/02/16 1536 12/02/16 1800  BP: 115/73   Pulse: 72   Resp: 13 13  Temp: 98.6 F (37 C)    3000 cc/1725 cc.   General: alert and cooperative Resp: clear to auscultation bilaterally Cardio: regular rate and rhythm, S1, S2 normal, no murmur, click, rub or gallop GI: incision: clean, dry and intact and hypoactive bowel sounds, soft, nontender. Extremities: extremities normal, atraumatic, no cyanosis or edema Vaginal Bleeding: minimal  PAS and Ted hose on.  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   Plan: Dilaudid PCA and Toradol for pain.   Will start Pepcid 20 mg Iv bid.  CBC and BMP in am.  Foley overnight.  Surgical findings and procedure discussed.    LOS: 0 days    Penny Edwards 12/02/2016, 6:14 PM

## 2016-12-02 NOTE — Anesthesia Procedure Notes (Signed)
Procedure Name: Intubation Date/Time: 12/02/2016 7:37 AM Performed by: Raenette Rover Pre-anesthesia Checklist: Patient identified Patient Re-evaluated:Patient Re-evaluated prior to inductionOxygen Delivery Method: Circle system utilized Preoxygenation: Pre-oxygenation with 100% oxygen Intubation Type: IV induction Ventilation: Mask ventilation without difficulty Laryngoscope Size: Mac and 3 Grade View: Grade I Tube type: Oral Tube size: 7.0 mm Number of attempts: 1 Airway Equipment and Method: Stylet Placement Confirmation: ETT inserted through vocal cords under direct vision,  positive ETCO2,  CO2 detector and breath sounds checked- equal and bilateral Secured at: 21 cm Tube secured with: Tape Dental Injury: Teeth and Oropharynx as per pre-operative assessment

## 2016-12-02 NOTE — Anesthesia Postprocedure Evaluation (Signed)
Anesthesia Post Note  Patient: ADALIND WEITZ  Procedure(s) Performed: Procedure(s) (LRB): HYSTERECTOMY ABDOMINAL WITH SALPINGECTOMY (Bilateral) CYSTOSCOPY (N/A)     Patient location during evaluation: PACU Anesthesia Type: General Level of consciousness: awake and alert, patient cooperative and oriented Pain management: pain level controlled Vital Signs Assessment: post-procedure vital signs reviewed and stable Respiratory status: spontaneous breathing, nonlabored ventilation and respiratory function stable Cardiovascular status: blood pressure returned to baseline and stable Postop Assessment: no signs of nausea or vomiting Anesthetic complications: no    Last Vitals:  Vitals:   12/02/16 1400 12/02/16 1427  BP:  118/80  Pulse:  69  Resp: 15 15  Temp:  36.4 C    Last Pain:  Vitals:   12/02/16 1427  TempSrc: Oral  PainSc:    Pain Goal: Patients Stated Pain Goal: 3 (12/02/16 0622)               Seleta Rhymes. Kaysen Sefcik

## 2016-12-02 NOTE — Op Note (Signed)
Penny, Edwards NO.:  [id number]  MEDICAL RECORD NO.:  [id number]  LOCATION:                                 FACILITY:  PHYSICIAN:  Lenard Galloway, M.D.        DATE OF BIRTH:  DATE OF PROCEDURE:  12/02/2016 DATE OF DISCHARGE:                              OPERATIVE REPORT   PREOPERATIVE DIAGNOSES:  Uterine fibroids, menorrhagia.  POSTOPERATIVE DIAGNOSES:  Uterine fibroids, menorrhagia.  PROCEDURE:  Total abdominal hysterectomy with bilateral salpingectomy, lysis of adhesions, and cystoscopy.  SURGEON:  Lenard Galloway, M.D.  ASSISTANT:  Lyman Speller, MD  ANESTHESIA:  General endotracheal.  IV FLUIDS:  2200 mL Ringer's lactate.  EBL:  300 mL.  Urine output 500 mL.  COMPLICATIONS:  None.  INDICATIONS FOR THE PROCEDURE:  The patient is a 41 year old gravida 0, para 0, African American female, status post abdominal myomectomy in 2011, who presents with a 25-week size uterus and known fibroids.  The patient has been experiencing heavy menstrual bleeding along with early satiety and abdominal fullness.  Ultrasound confirms the presence of multiple fibroids.  The patient has a known large lower uterine segment fibroid.  She has had a benign endometrial biopsy.  She declined future childbearing, and she is requesting to proceed with a hysterectomy procedure.  The patient has been pretreated with 6 months of Lupron therapy.  A plan is made to proceed with an examination under anesthesia and laparoscopic versus total abdominal hysterectomy with bilateral salpingectomy and cystoscopy based on the patient's exam under anesthesia.  Risks, benefits, and alternatives have been reviewed with the patient who wishes to proceed.  FINDINGS:  Exam under anesthesia revealed a 22-week size uterus.  The lower pelvis was filled with uterine fibroid.  At the time of laparotomy, the patient was noted to have a 22-week size uterus with the presence of a 10 cm  posterior lower uterine segment fibroid.  Bilateral ovaries were normal.  The fallopian tubes were unremarkable.  There were some small omental adhesions to the left anterior fundus.  These were completely lysed.  Along the right posterior uterosacral ligament, there was evidence of possible endometriosis.  The appendix, liver, gallbladder, kidneys, and para-aortic regions were Unremarkable.  For the cystoscopy at the termination of the procedure, the bladder  was noted to be intact and normal throughout 360 degrees including the bladder dome and trigone.  There was no foreign body in the bladder or the urethra.  The ureters were patent bilaterally.  SPECIMEN:  The uterus, cervix, and bilateral fallopian tubes were sent to Pathology.  DESCRIPTION OF PROCEDURE:  The patient was re-identified in the preoperative hold area.  She did receive Lovenox for DVT prophylaxis along with receiving TED hose and PAS stockings.  The patient received cefotetan 2 g IV for antibiotic prophylaxis.  In the operating room, the patient was placed in the dorsal lithotomy position with Allen stirrups.  The TrenGuard was used for stabilization on the operating room table as well.  After general endotracheal anesthesia was induced, the lower abdomen and vagina were sterilely prepped and a 3-way Foley catheter was placed inside the bladder.  The patient  was then sterilely draped.  A Pfannenstiel incision was created along the patient's prior Pfannenstiel incision.  This incision was created with the scalpel and then taken through the subcutaneous layer with monopolar cautery for hemostasis.  The fascia was then incised bilaterally with a Mayo scissors.  The rectus muscles were separated from the fascia superiorly and inferiorly using sharp dissection and monopolar cautery for hemostasis.  The rectus muscles were divided in the midline.  The parietal peritoneum was elevated with 2 hemostat clamps and was  entered sharply.  The incision was extended cranially and caudally.  The omental adhesions to the anterior fundus were lysed using monopolar cautery.  Some of the adhesions were clamped with hemostat, sharply divided, and then free tied with 0 Vicryl.  The pelvis was explored at this time.  Access was gained to the right round ligament which was ligated with a transfixing suture of 0 Vicryl.  The round ligament was back clamped. It was then divided with monopolar cautery.  Monopolar cautery and sharp dissection were used to open the anterior and posterior leaves of the broad ligament.  Clamps were placed across the utero-ovarian pedicles on the patient's right-hand side.  The pedicle was sharply divided and was then suture ligated with 0 Vicryl.  This provided better access to the patient's right fallopian tube.  The right fallopian tube was then clamped from the surrounding mesosalpinx.  It was divided using monopolar cautery and it was then sutured with 0 Vicryl all the way to the level of the proximal fallopian tube.  The fallopian tube was then excised with cautery and sent to Pathology.  The same procedure that was performed on the patient's right-hand side was repeated then on the patient's left-hand side.  Again, the left fallopian tube was sent to Pathology.  There was then additional access to the pelvis and elevation of the uterus.  It was clear that the utero-ovarian ligaments on both sides had not been completely divided.  Each of these pedicles were then reclamped, sharply divided, and then suture ligated with 0 Vicryl.  The bladder flap was taken down anteriorly using sharp dissection with the Metzenbaum scissors.  The peritoneum was taken down posteriorly as well.  Each of the uterine arteries were skeletonized.  The uterine arteries were then doubly clamped, sharply divided, and each suture ligated with 2 sutures of 0 Vicryl.  The uterine fundus was amputated from  the cervix using monopolar cautery, and the specimen was set aside to be sent to Pathology with the entire specimen.  The remaining cardinal ligaments were clamped, sharply divided, and suture ligated with 0 Vicryl bilaterally.  Entry was possible at this time into the left vaginal apex.  The right uterosacral ligament was clamped, sharply divided, and was sutured with a transfixing suture of 0 Vicryl. The cervix was sharply excised with the Jorgenson scissors and was sent to Pathology with the remaining entire specimen.  A modified Richardson angle suture was created on the patient's left-hand side using 0 Vicryl suture.  The vaginal cuff was closed with figure-of- eight sutures of 0 Vicryl.  The pelvis was irrigated and suctioned.  There was some bleeding along the patient's left pelvic sidewall near the left uterine artery pedicle and along the peritoneum and along the side.  The left ureter was identified.  A right angle clamp was then placed across the bleeding area and a figure-of-eight suture of 0 Vicryl was placed, which created good hemostasis.  The peritoneal edge was  then cauterized as well.  The pelvis was once again irrigated and suctioned, and hemostasis was noted to be adequate at this time.  All pedicle sites were re-examined and hemostatic.  Arista was placed over the pedicle sites and the surgical field in the pelvis.  Hemostasis was good.  Two moistened lap pads which were placed in the abdomen were removed at this time.  The abdomen was closed.  The parietal peritoneum was closed with a running suture of 2-0 Vicryl.  The rectus muscles were irrigated at this time and were found to be hemostatic.  The fascia was closed with a running suture of 0 Vicryl bilaterally.  The subcutaneous layer was irrigated, suctioned, and made hemostatic with monopolar cautery. Interrupted sutures of 2-0 plain were placed in the subcutaneous layer. The skin was closed with a  subcuticular closure of 3-0 Vicryl. Dermabond was placed over the incision.  The patient's 3-way Foley catheter was removed, and cystoscopy was performed. The findings are as noted above. The bladder was drained of  all cystoscopic fluid, and a 14-French Foley catheter was placed and left to gravity drainage.  A sponge on a stick was placed in the vagina, and there was no evidence of any clot or bleeding.  The patient was awakened and extubated and escorted to the recovery room in stable condition.  There were no complications to the procedure.  All needle, instrument, and sponge counts were correct.   Lenard Galloway, M.D.   BES/MEDQ  D:  12/02/2016  T:  12/02/2016  Job:  [id number]

## 2016-12-02 NOTE — Transfer of Care (Signed)
Immediate Anesthesia Transfer of Care Note  Patient: Penny Edwards  Procedure(s) Performed: Procedure(s) with comments: HYSTERECTOMY ABDOMINAL WITH SALPINGECTOMY (Bilateral) - possible TAH due to large fibroids CYSTOSCOPY (N/A)  Patient Location: PACU  Anesthesia Type:General  Level of Consciousness: awake, alert , oriented, drowsy and patient cooperative  Airway & Oxygen Therapy: Patient Spontanous Breathing and Patient connected to nasal cannula oxygen  Post-op Assessment: Report given to RN and Post -op Vital signs reviewed and stable  Post vital signs: Reviewed and stable  Last Vitals:  Vitals:   12/02/16 0622  BP: (!) 137/103  Pulse: 74  Resp: 16  Temp: 36.8 C    Last Pain:  Vitals:   12/02/16 0622  TempSrc: Oral      Patients Stated Pain Goal: 3 (63/87/56 4332)  Complications: No apparent anesthesia complications

## 2016-12-03 ENCOUNTER — Encounter (HOSPITAL_COMMUNITY): Payer: Self-pay | Admitting: Obstetrics and Gynecology

## 2016-12-03 LAB — BASIC METABOLIC PANEL
Anion gap: 6 (ref 5–15)
BUN: 10 mg/dL (ref 6–20)
CHLORIDE: 101 mmol/L (ref 101–111)
CO2: 30 mmol/L (ref 22–32)
Calcium: 8.9 mg/dL (ref 8.9–10.3)
Creatinine, Ser: 0.94 mg/dL (ref 0.44–1.00)
GFR calc Af Amer: >60 mL/min (ref >60)
GFR calc non Af Amer: >60 mL/min (ref >60)
Glucose, Bld: 149 mg/dL — ABNORMAL HIGH (ref 65–99)
POTASSIUM: 4.1 mmol/L (ref 3.5–5.1)
SODIUM: 137 mmol/L (ref 135–145)

## 2016-12-03 LAB — CBC
HCT: 30.8 % — ABNORMAL LOW (ref 36.0–46.0)
HEMOGLOBIN: 10.7 g/dL — AB (ref 12.0–15.0)
MCH: 32.6 pg (ref 26.0–34.0)
MCHC: 34.7 g/dL (ref 30.0–36.0)
MCV: 93.9 fL (ref 78.0–100.0)
Platelets: 212 10*3/uL (ref 150–400)
RBC: 3.28 MIL/uL — AB (ref 3.87–5.11)
RDW: 12.7 % (ref 11.5–15.5)
WBC: 8.4 10*3/uL (ref 4.0–10.5)

## 2016-12-03 MED ORDER — ALUM & MAG HYDROXIDE-SIMETH 200-200-20 MG/5ML PO SUSP
20.0000 mL | Freq: Once | ORAL | Status: AC
  Administered 2016-12-03: 20 mL via ORAL
  Filled 2016-12-03: qty 30

## 2016-12-03 MED ORDER — ALUM & MAG HYDROXIDE-SIMETH 200-200-20 MG/5ML PO SUSP
30.0000 mL | ORAL | Status: DC | PRN
  Administered 2016-12-03: 30 mL via ORAL
  Filled 2016-12-03: qty 30

## 2016-12-03 NOTE — Progress Notes (Signed)
69ml of dilaudid PCA wasted in sink with Ivin Poot, RN Toya Smothers, RN

## 2016-12-03 NOTE — Progress Notes (Signed)
1 Day Post-Op Procedure(s) (LRB): HYSTERECTOMY ABDOMINAL WITH SALPINGECTOMY (Bilateral) CYSTOSCOPY (N/A)  Subjective: Patient reports nausea and no problems voiding.   Had reflux during the night.   Pepcid IV did not help.  Mylanta was helpful.  Usually takes Zantac to control her symptoms.  Denies chest pain or shortness of breath.   States from a surgical standpoint she feels great.  Denies pain.  Voiding well.  Ambulating in halls.  Objective: I have reviewed patient's vital signs, intake and output and labs. Vitals:   12/03/16 0610 12/03/16 0746  BP:  128/71  Pulse:  80  Resp: 14 14  Temp:  99 F (37.2 C)   I/O - 2550 cc/ 2850 cc.  Hgb 10.8 Glucose 149.  General: alert and cooperative Resp: clear to auscultation bilaterally Cardio: regular rate and rhythm, S1, S2 normal, no murmur, click, rub or gallop GI: soft, non-tender; bowel sounds normal; no masses,  no organomegaly and incision: dressing clean, dry, and intact. Extremities: PAS and ted hose on.  Vaginal Bleeding: none  Assessment: s/p Procedure(s) with comments: HYSTERECTOMY ABDOMINAL WITH SALPINGECTOMY (Bilateral) - possible TAH due to large fibroids CYSTOSCOPY (N/A): progressing well  Having reflux.   Plan: Advance diet Encourage ambulation Advance to PO medication Mylanta for reflux.   Edgar for Zantac from home but will need to give to the nurse to give to pharmacy to administer medication to patient.  Will check hemoglobin A1C in outpatient setting.   LOS: 1 day    Brook Matthew Saras 12/03/2016, 10:12 AM

## 2016-12-04 MED ORDER — OXYCODONE-ACETAMINOPHEN 5-325 MG PO TABS: 1.0000 | ORAL_TABLET | ORAL | 0 refills | Status: DC | PRN

## 2016-12-04 NOTE — Progress Notes (Signed)
Pt discharged with written instructions. Pt verbalized an understanding. No concerns noted. Toya Smothers, RN

## 2016-12-04 NOTE — Discharge Instructions (Signed)
Abdominal Hysterectomy, Care After °This sheet gives you information about how to care for yourself after your procedure. Your health care provider may also give you more specific instructions. If you have problems or questions, contact your health care provider. °What can I expect after the procedure? °After your procedure, it is common to have: °· Pain. °· Fatigue. °· Poor appetite. °· Less interest in sex. °· Vaginal bleeding and discharge. You may need to use a sanitary napkin after this procedure. ° °Follow these instructions at home: °Bathing °· Do not take baths, swim, or use a hot tub until your health care provider approves. Ask your health care provider if you can take showers. You may only be allowed to take sponge baths for bathing. °· Keep the bandage (dressing) dry until your health care provider says it can be removed. °Incision care °· Follow instructions from your health care provider about how to take care of your incision. Make sure you: °? Wash your hands with soap and water before you change your bandage (dressing). If soap and water are not available, use hand sanitizer. °? Change your dressing as told by your health care provider. °? Leave stitches (sutures), skin glue, or adhesive strips in place. These skin closures may need to stay in place for 2 weeks or longer. If adhesive strip edges start to loosen and curl up, you may trim the loose edges. Do not remove adhesive strips completely unless your health care provider tells you to do that. °· Check your incision area every day for signs of infection. Check for: °? Redness, swelling, or pain. °? Fluid or blood. °? Warmth. °? Pus or a bad smell. °Activity °· Do gentle, daily exercises as told by your health care provider. You may be told to take short walks every day and go farther each time. °· Do not lift anything that is heavier than 10 lb (4.5 kg), or the limit that your health care provider tells you, until he or she says that it is  safe. °· Do not drive or use heavy machinery while taking prescription pain medicine. °· Do not drive for 24 hours if you were given a medicine to help you relax (sedative). °· Follow your health care provider's instructions about exercise, driving, and general activities. Ask your health care provider what activities are safe for you. °Lifestyle °· Do not douche, use tampons, or have sex for at least 6 weeks or as told by your health care provider. °· Do not drink alcohol until your health care provider approves. °· Drink enough fluid to keep your urine clear or pale yellow. °· Try to have someone at home with you for the first 1-2 weeks to help. °· Do not use any products that contain nicotine or tobacco, such as cigarettes and e-cigarettes. These can delay healing. If you need help quitting, ask your health care provider. °General instructions °· Take over-the-counter and prescription medicines only as told by your health care provider. °· Do not take aspirin or ibuprofen. These medicines can cause bleeding. °· To prevent or treat constipation while you are taking prescription pain medicine, your health care provider may recommend that you: °? Drink enough fluid to keep your urine clear or pale yellow. °? Take over-the-counter or prescription medicines. °? Eat foods that are high in fiber, such as fresh fruits and vegetables, whole grains, and beans. °? Limit foods that are high in fat and processed sugars, such as fried and sweet foods. °· Keep all   follow-up visits as told by your health care provider. This is important. °Contact a health care provider if: °· You have chills or fever. °· You have redness, swelling, or pain around your incision. °· You have fluid or blood coming from your incision. °· Your incision feels warm to the touch. °· You have pus or a bad smell coming from your incision. °· Your incision breaks open. °· You feel dizzy or light-headed. °· You have pain or bleeding when you urinate. °· You  have persistent diarrhea. °· You have persistent nausea and vomiting. °· You have abnormal vaginal discharge. °· You have a rash. °· You have any type of abnormal reaction or you develop an allergy to your medicine. °· Your pain medicine does not help. °Get help right away if: °· You have a fever and your symptoms suddenly get worse. °· You have severe abdominal pain. °· You have shortness of breath. °· You faint. °· You have pain, swelling, or redness in your leg. °· You have heavy vaginal bleeding with blood clots. °Summary °· After your procedure, it is common to have pain, fatigue and vaginal discharge. °· Do not take baths, swim, or use a hot tub until your health care provider approves. Ask your health care provider if you can take showers. You may only be allowed to take sponge baths for bathing. °· Follow your health care provider's instructions about exercise, driving, and general activities. Ask your health care provider what activities are safe for you. °· Do not lift anything that is heavier than 10 lb (4.5 kg), or the limit that your health care provider tells you, until he or she says that it is safe. °· Try to have someone at home with you for the first 1-2 weeks to help. °This information is not intended to replace advice given to you by your health care provider. Make sure you discuss any questions you have with your health care provider. °Document Released: 12/14/2004 Document Revised: 05/15/2016 Document Reviewed: 05/15/2016 °[employer]vier Interactive Patient Education © 2017 [employer] ° °

## 2016-12-04 NOTE — Progress Notes (Signed)
2 Days Post-Op Procedure(s) (LRB): HYSTERECTOMY ABDOMINAL WITH SALPINGECTOMY (Bilateral) CYSTOSCOPY (N/A)  Subjective: Patient reports tolerating PO and + flatus.   Reflux resolved.  Right hand numbness resolving.   Objective: I have reviewed patient's vital signs and intake and output. T max 99.5.   General: alert and cooperative Resp: clear to auscultation bilaterally Cardio: regular rate and rhythm, S1, S2 normal, no murmur, click, rub or gallop GI: soft, non-tender; bowel sounds normal; no masses,  no organomegaly and incision: clean, dry and intact Extremities: Ted hose on.   Pathology report - degenerating fibroids, normal cervix, normal tubes.   Assessment: s/p Procedure(s) with comments: HYSTERECTOMY ABDOMINAL WITH SALPINGECTOMY (Bilateral) - possible TAH due to large fibroids CYSTOSCOPY (N/A): progressing well and ready for discharge  Plan: Discharge home  Percocet and Motrin for pain.  Take iron sulfate or iron gluconate once daily.  Follow up in 5 days.  Instruction and precautions reviewed.    LOS: 2 days    Arloa Koh 12/04/2016, 8:06 AM

## 2016-12-09 ENCOUNTER — Ambulatory Visit: Payer: 59 | Admitting: Obstetrics and Gynecology

## 2016-12-09 NOTE — Progress Notes (Deleted)
GYNECOLOGY  VISIT   HPI: 41 y.o.   Married  [place] American  female   [id number] with No LMP recorded. Patient has had an injection.   here for  Post op visit  HYSTERECTOMY ABDOMINAL WITH SALPINGECTOMY (Bilateral Abdomen)  CYSTOSCOPY (N/A Bladder)  GYNECOLOGIC HISTORY: No LMP recorded. Patient has had an injection. Contraception:  Hysterectomy Menopausal hormone therapy:  n/a Last mammogram:  11-22-17Neg, BiRads1 with [facility] Last pap smear:   09/18/16 Pap and HR HPV negative        OB History    Gravida Para Term Preterm AB Living   0 0 0 0 0 0   SAB TAB Ectopic Multiple Live Births   0 0 0 0 0         Patient Active Problem List   Diagnosis Date Noted  . 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/02/2016    Past Medical History:  Diagnosis Date  . Constipation   . Fibroids   . GERD (gastroesophageal reflux disease)   . Patient is Jehovah's Witness   . Seasonal allergies     Past Surgical History:  Procedure Laterality Date  . BREAST SURGERY     breast reduction  . COLONOSCOPY    . CYSTOSCOPY N/A 12/02/2016   Procedure: CYSTOSCOPY;  Surgeon: Nunzio Cobbs, MD;  Location: [facility];  Service: Gynecology;  Laterality: N/A;  . ESOPHAGOGASTRODUODENOSCOPY ENDOSCOPY    . HYSTERECTOMY ABDOMINAL WITH SALPINGECTOMY Bilateral 12/02/2016   Procedure: HYSTERECTOMY ABDOMINAL WITH SALPINGECTOMY;  Surgeon: Nunzio Cobbs, MD;  Location: [facility];  Service: Gynecology;  Laterality: Bilateral;  possible TAH due to large fibroids  . IR GENERIC HISTORICAL  05/07/2016   IR RADIOLOGIST EVAL & MGMT 05/07/2016 Greggory Keen, MD [facility] INTERV RAD  . MYOMECTOMY  11/29/2009   Dr.Brook Silva - abdominal myomectomy  . WISDOM TOOTH EXTRACTION      Current Outpatient Prescriptions  Medication Sig Dispense Refill  . Ascorbic Acid (VITAMIN C PO) Take 1 tablet by mouth daily.    . Cholecalciferol (VITAMIN D PO) Take 1 tablet by mouth daily.    . fluticasone  (FLONASE) 50 MCG/ACT nasal spray Place 1 spray into both nostrils daily as needed for allergies or rhinitis.    Marland Kitchen ibuprofen (ADVIL,MOTRIN) 200 MG tablet Take 600-800 mg by mouth every 8 (eight) hours as needed for mild pain (depends on pain if takes 3-4 tablets).     . Omega-3 Fatty Acids (FISH OIL PO) Take 1 capsule by mouth daily.    Marland Kitchen oxyCODONE-acetaminophen (PERCOCET/ROXICET) 5-325 MG tablet Take 1-2 tablets by mouth every 4 (four) hours as needed for severe pain (moderate to severe pain (when tolerating fluids)). 30 tablet 0  . Probiotic Product (PROBIOTIC PO) Take 1 tablet by mouth daily.    . RaNITidine HCl (ZANTAC PO) Take 1 tablet by mouth daily as needed (indigestion).     No current facility-administered medications for this visit.      ALLERGIES: Patient has no known allergies.  Family History  Problem Relation Age of Onset  . Diabetes Mother   . Thyroid disease Mother        hyperthyroid  . Diabetes Father   . Hypertension Father   . Hypertension Sister   . Breast cancer Maternal Aunt 51       A & W  . Stroke Paternal Grandmother   . Hypertension Sister     Social History   Social History  . Marital  status: Married    Spouse name: N/A  . Number of children: N/A  . Years of education: N/A   Occupational History  . Not on file.   Social History Main Topics  . Smoking status: Never Smoker  . Smokeless tobacco: Never Used  . Alcohol use 2.4 oz/week    2 Glasses of wine, 2 Shots of liquor per week  . Drug use: No  . Sexual activity: Yes    Partners: Male    Birth control/ protection: Condom   Other Topics Concern  . Not on file   Social History Narrative  . No narrative on file    ROS:  Pertinent items are noted in HPI.  PHYSICAL EXAMINATION:    There were no vitals taken for this visit.    General appearance: alert, cooperative and appears stated age Head: Normocephalic, without obvious abnormality, atraumatic Neck: no adenopathy, supple,  symmetrical, trachea midline and thyroid normal to inspection and palpation Lungs: clear to auscultation bilaterally Breasts: normal appearance, no masses or tenderness, No nipple retraction or dimpling, No nipple discharge or bleeding, No axillary or supraclavicular adenopathy Heart: regular rate and rhythm Abdomen: soft, non-tender, no masses,  no organomegaly Extremities: extremities normal, atraumatic, no cyanosis or edema Skin: Skin color, texture, turgor normal. No rashes or lesions Lymph nodes: Cervical, supraclavicular, and axillary nodes normal. No abnormal inguinal nodes palpated Neurologic: Grossly normal  Pelvic: External genitalia:  no lesions              Urethra:  normal appearing urethra with no masses, tenderness or lesions              Bartholins and Skenes: normal                 Vagina: normal appearing vagina with normal color and discharge, no lesions              Cervix: no lesions                Bimanual Exam:  Uterus:  normal size, contour, position, consistency, mobility, non-tender              Adnexa: no mass, fullness, tenderness              Rectal exam: {yes no:314532}.  Confirms.              Anus:  normal sphincter tone, no lesions  Chaperone was present for exam.  ASSESSMENT     PLAN     An After Visit Summary was printed and given to the patient.  ______ minutes face to face time of which over 50% was spent in counseling.

## 2016-12-11 NOTE — Discharge Summary (Signed)
Physician Discharge Summary  Patient ID: Penny Edwards MRN: [id number] DOB/AGE: 1975/12/18 41 y.o.  Admit date: 12/02/2016 Discharge date:  12/04/16  Admission Diagnoses: 1.  Uterine fibroids.  2.  Elevated glucose level post op.  Discharge Diagnoses:  1.  Uterine fibroids.  2.  Elevated glucose level post op. 3.  Status post total abdominal hysterectomy with bilateral salpingectomy and cystoscopy.   Active Problems:   Status post total abdominal hysterectomy  Discharged Condition: good  Hospital Course:  The patient was admitted on 12/02/16 for a total abdominal hysterectomy with bilateral salpingectomy and cystoscopy which were performed without complication while under general anesthesia.  The patient's post op course was uneventful.  She had a Dilaudid PCA and Toradol for pain control initially, and this was converted over to Percocet and Motrin on post op day one when the patient began taking po well.  The patient had a bout of her usual reflux symptoms during the first 24 hours post op, and this was treated with IV Pepcid, Mylanta, and then then Zantac. She ambulated independently and wore PAS and Ted hose for DVT prophylaxis while in bed.  She did receive a dosage of Lovenox preop.   Her foley catheter were removed on post op day one, and she voided good volumes. The patient's vital signs remained stable and she demonstrated no signs of infection during her hospitalization.  She had a low grade fever in the 99 range which was attributed to atelectasis.  She used her incentive spirometer. The patient's post op day one Hgb was 10.7. Her post op day one glucose level was 149. She had very minimal vaginal bleeding, and her incision demonstrated no signs of erythema or significant drainage.  She was found to be in good condition and ready for discharge on post op day two.  Consults: None  Significant Diagnostic Studies: labs:  See hospital course.  Treatments: surgery:  total abdominal  hysterectomy with bilateral salpingectomy and cystoscopy.  Discharge Exam:  Blood pressure (!) 135/94, pulse 77, temperature 98.7 F (37.1 C), temperature source Oral, resp. rate 16, height 5\' 6"  (1.676 m), weight 205 lb (93 kg), SpO2 97 %. General: alert and cooperative Resp: clear to auscultation bilaterally Cardio: regular rate and rhythm, S1, S2 normal, no murmur, click, rub or gallop GI: soft, non-tender; bowel sounds normal; no masses,  no organomegaly and incision: clean, dry and intact Extremities: Ted hose on.   Disposition: 01-Home or Self Care  Discharge instructions were reviewed in verbal and written form.   Allergies as of 12/04/2016   No Known Allergies     Medication List    STOP taking these medications   DEPO-PROVERA IM     TAKE these medications   FISH OIL PO Take 1 capsule by mouth daily.   fluticasone 50 MCG/ACT nasal spray Commonly known as:  FLONASE Place 1 spray into both nostrils daily as needed for allergies or rhinitis.   ibuprofen 200 MG tablet Commonly known as:  ADVIL,MOTRIN Take 600-800 mg by mouth every 8 (eight) hours as needed for mild pain (depends on pain if takes 3-4 tablets).   oxyCODONE-acetaminophen 5-325 MG tablet Commonly known as:  PERCOCET/ROXICET Take 1-2 tablets by mouth every 4 (four) hours as needed for severe pain (moderate to severe pain (when tolerating fluids)).   PROBIOTIC PO Take 1 tablet by mouth daily.   VITAMIN C PO Take 1 tablet by mouth daily.   VITAMIN D PO Take 1 tablet by mouth daily.  ZANTAC PO Take 1 tablet by mouth daily as needed (indigestion).     She will take iron sulfate 325 mg my mouth once daily.   Follow-up Information    Nunzio Cobbs, MD In 5 days.   Specialty:  Obstetrics and Gynecology Contact information: [address] [place] [address] [phone]         The patient will have a hemoglobin A1C drawn as an outpatient at her post op visit.    Signed: Arloa Koh 12/11/2016, 1:30 PM

## 2016-12-12 ENCOUNTER — Ambulatory Visit (INDEPENDENT_AMBULATORY_CARE_PROVIDER_SITE_OTHER): Payer: 59 | Admitting: Obstetrics and Gynecology

## 2016-12-12 ENCOUNTER — Encounter: Payer: Self-pay | Admitting: Obstetrics and Gynecology

## 2016-12-12 VITALS — BP 116/70 | HR 88 | Resp 16 | Wt 198.0 lb

## 2016-12-12 DIAGNOSIS — Z9889 Other specified postprocedural states: Secondary | ICD-10-CM

## 2016-12-12 DIAGNOSIS — R21 Rash and other nonspecific skin eruption: Secondary | ICD-10-CM

## 2016-12-12 DIAGNOSIS — R7309 Other abnormal glucose: Secondary | ICD-10-CM

## 2016-12-12 HISTORY — DX: Other abnormal glucose: R73.09

## 2016-12-12 LAB — CBC
Hematocrit: 33.7 % — ABNORMAL LOW (ref 34.0–46.6)
Hemoglobin: 11.2 g/dL (ref 11.1–15.9)
MCH: 31.9 pg (ref 26.6–33.0)
MCHC: 33.2 g/dL (ref 31.5–35.7)
MCV: 96 fL (ref 79–97)
PLATELETS: 326 10*3/uL (ref 150–379)
RBC: 3.51 x10E6/uL — ABNORMAL LOW (ref 3.77–5.28)
RDW: 13.1 % (ref 12.3–15.4)
WBC: 6.4 10*3/uL (ref 3.4–10.8)

## 2016-12-12 LAB — HEMOGLOBIN A1C
ESTIMATED AVERAGE GLUCOSE: 117 mg/dL
HEMOGLOBIN A1C: 5.7 % — AB (ref 4.8–5.6)

## 2016-12-12 NOTE — Progress Notes (Signed)
GYNECOLOGY  VISIT   HPI: 41 y.o.   Married  [place] American  female   [place] with Patient's last menstrual period was 06/24/2016.   here for 1 week post op HYSTERECTOMY ABDOMINAL WITH SALPINGECTOMY (Bilateral Abdomen)CYSTOSCOPY  Pathology - Benign fibroids, cervix and tubes.  Reporting rash the day after leaving the hospital.  Last Percocet and last Motrin were last week.   No bleeding from vagina.  Constipation.  Voiding well.   Not taking any iron.  CBC 10.7 post op day one.   Had glucose of 149 on post op day one.  GYNECOLOGIC HISTORY: Patient's last menstrual period was 06/24/2016. Contraception:  Hysterectomy Menopausal hormone therapy:  none Last mammogram:  11-22-17Neg, BiRads1 with [facility] Last pap smear:   09/18/16 Pap and HR HPV negative        OB History    Gravida Para Term Preterm AB Living   0 0 0 0 0 0   SAB TAB Ectopic Multiple Live Births   0 0 0 0 0         Patient Active Problem List   Diagnosis Date Noted  . 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/02/2016    Past Medical History:  Diagnosis Date  . Constipation   . Fibroids   . GERD (gastroesophageal reflux disease)   . Patient is Jehovah's Witness   . Seasonal allergies     Past Surgical History:  Procedure Laterality Date  . BREAST SURGERY     breast reduction  . COLONOSCOPY    . CYSTOSCOPY N/A 12/02/2016   Procedure: CYSTOSCOPY;  Surgeon: Nunzio Cobbs, MD;  Location: [facility];  Service: Gynecology;  Laterality: N/A;  . ESOPHAGOGASTRODUODENOSCOPY ENDOSCOPY    . HYSTERECTOMY ABDOMINAL WITH SALPINGECTOMY Bilateral 12/02/2016   Procedure: HYSTERECTOMY ABDOMINAL WITH SALPINGECTOMY;  Surgeon: Nunzio Cobbs, MD;  Location: [facility];  Service: Gynecology;  Laterality: Bilateral;  possible TAH due to large fibroids  . IR GENERIC HISTORICAL  05/07/2016   IR RADIOLOGIST EVAL & MGMT 05/07/2016 Greggory Keen, MD [facility] INTERV RAD  . MYOMECTOMY  11/29/2009    Dr.Donnamarie Shankles Silva - abdominal myomectomy  . WISDOM TOOTH EXTRACTION      Current Outpatient Prescriptions  Medication Sig Dispense Refill  . Ascorbic Acid (VITAMIN C PO) Take 1 tablet by mouth daily.    . Cholecalciferol (VITAMIN D PO) Take 1 tablet by mouth daily.    . fluticasone (FLONASE) 50 MCG/ACT nasal spray Place 1 spray into both nostrils daily as needed for allergies or rhinitis.    Marland Kitchen ibuprofen (ADVIL,MOTRIN) 200 MG tablet Take 600-800 mg by mouth every 8 (eight) hours as needed for mild pain (depends on pain if takes 3-4 tablets).     . Omega-3 Fatty Acids (FISH OIL PO) Take 1 capsule by mouth daily.    Marland Kitchen oxyCODONE-acetaminophen (PERCOCET/ROXICET) 5-325 MG tablet Take 1-2 tablets by mouth every 4 (four) hours as needed for severe pain (moderate to severe pain (when tolerating fluids)). 30 tablet 0  . Probiotic Product (PROBIOTIC PO) Take 1 tablet by mouth daily.    . RaNITidine HCl (ZANTAC PO) Take 1 tablet by mouth daily as needed (indigestion).     No current facility-administered medications for this visit.      ALLERGIES: Patient has no known allergies.  Family History  Problem Relation Age of Onset  . Diabetes Mother   . Thyroid disease Mother        hyperthyroid  .  Diabetes Father   . Hypertension Father   . Hypertension Sister   . Breast cancer Maternal Aunt 57       A & W  . Stroke Paternal Grandmother   . Hypertension Sister     Social History   Social History  . Marital status: Married    Spouse name: N/A  . Number of children: N/A  . Years of education: N/A   Occupational History  . Not on file.   Social History Main Topics  . Smoking status: Never Smoker  . Smokeless tobacco: Never Used  . Alcohol use 2.4 oz/week    2 Glasses of wine, 2 Shots of liquor per week  . Drug use: No  . Sexual activity: Yes    Partners: Male    Birth control/ protection: Condom   Other Topics Concern  . Not on file   Social History Narrative  . No narrative on  file    ROS:  Pertinent items are noted in HPI.  PHYSICAL EXAMINATION:    BP 116/70 (BP Location: Right Arm, Patient Position: Sitting, Cuff Size: Normal)   Pulse 88   Resp 16   Wt 198 lb (89.8 kg)   LMP 06/24/2016   BMI 31.96 kg/m     General appearance: alert, cooperative and appears stated age   Abdomen: incision intact.  Soft, non-tender, no masses,  no organomegaly   Skin: Skin color, texture, turgor normal.  Pin point eruption of tiny vesicles on trunk and flank.  ASSESSMENT  Status post TAH/ bilateral salpingectomy.  Constipation. Skin eruption.   I suspect the prep that was used for surgery may have caused this. Elevated glucose in hospital. Post op anemia.  Mild.  PLAN  Miralax prn.  Use rubbing alcohol to remove any remaining prep.  Benadryl or hydrocortisone cream.  Oral Benadryl or Claritin prn.  Check CBC and hemoglobin A1C.  Follow up for 6 week post op check.    An After Visit Summary was printed and given to the patient.

## 2016-12-13 ENCOUNTER — Encounter: Payer: Self-pay | Admitting: Obstetrics and Gynecology

## 2017-01-01 ENCOUNTER — Telehealth: Payer: Self-pay | Admitting: Obstetrics and Gynecology

## 2017-01-01 NOTE — Telephone Encounter (Signed)
Patient had surgery 12/02/16 and would like to know when she can begin exercising again.

## 2017-01-01 NOTE — Telephone Encounter (Signed)
Patient had an abdominal hysterectomy with salpingectomy on 12/02/2016. Would like to know if she can perform jumping jacks, core exercises, lift light weights, and use resistant bands at this time. Advised will review with Dr.Silva and return call.

## 2017-01-01 NOTE — Telephone Encounter (Signed)
Spoke with patient. Advised of message as seen below from [place]. Patient verbalizes understanding. Will close encounter.

## 2017-01-01 NOTE — Telephone Encounter (Signed)
No exercise for 6 week post op.  I will need to see her for her 6 week visit before she can start exercising or have sexual activity.

## 2017-01-08 ENCOUNTER — Telehealth: Payer: Self-pay | Admitting: Obstetrics and Gynecology

## 2017-01-08 NOTE — Telephone Encounter (Signed)
Ok for a note to keep her out of work until I see her for her post op visit.  If her disability will not be extended until this time for any reason, I can see her sooner.

## 2017-01-08 NOTE — Telephone Encounter (Signed)
Patient called, states she had outpatient surgery on 12/02/16. Patient states employer has her out of work until 01/13/17, six weeks from surgery date. However, patient is scheduled for final post operative appointment on 01/15/17.  Patient is requesting documentation stating she is out of work until final post operative appointment on 01/15/17.   Forwarding to Dr Quincy Simmonds for review

## 2017-01-09 NOTE — Telephone Encounter (Signed)
Placed call to patients employer, [employer] [phone], to convey to her message from Dr Quincy Simmonds, regarding out of work status from surgery.  Left voicemail message requesting a return call   cc: Dr Quincy Simmonds

## 2017-01-09 NOTE — Telephone Encounter (Signed)
Received call back from patients employer, [employer]. Advised of information, as relayed in previous message from Dr Quincy Simmonds.  Patients final post operative appointment is on 01/15/17, ok to be out of work until that evaluation. Ms Hassell Done, said that will be fine, but ask that I refax the FMLA, but note patient will be out of work until 01/15/17, instead of 01/13/17.  She advised I could make the notation with my initials. I have completed and faxed to [phone]   Forwarding to Dr Quincy Simmonds for final review

## 2017-01-09 NOTE — Telephone Encounter (Signed)
Thank you.  ?You may close the encounter.  ?

## 2017-01-15 ENCOUNTER — Encounter: Payer: Self-pay | Admitting: Obstetrics and Gynecology

## 2017-01-15 ENCOUNTER — Ambulatory Visit (INDEPENDENT_AMBULATORY_CARE_PROVIDER_SITE_OTHER): Payer: 59 | Admitting: Obstetrics and Gynecology

## 2017-01-15 VITALS — BP 136/78 | HR 84 | Resp 16 | Wt 200.8 lb

## 2017-01-15 DIAGNOSIS — Z9889 Other specified postprocedural states: Secondary | ICD-10-CM

## 2017-01-15 NOTE — Progress Notes (Signed)
GYNECOLOGY  VISIT   HPI: 41 y.o.   Married  [place] American  female   [place] with Patient's last menstrual period was 06/24/2016.   here for 1 week post op HYSTERECTOMY ABDOMINAL WITH SALPINGECTOMY (Bilateral Abdomen)CYSTOSCOPY.  Feels great.  Can sleep on her abdomen.  More energy.   Has HgbA1C which was 5.7.   Not taking any iron right now.   GYNECOLOGIC HISTORY: Patient's last menstrual period was 06/24/2016. Contraception:  Hysterectomy Menopausal hormone therapy:  none Last mammogram:  11-22-17Neg, BiRads1 with [facility] Last pap smear:   09/18/16 Pap and HR HPV negative        OB History    Gravida Para Term Preterm AB Living   0 0 0 0 0 0   SAB TAB Ectopic Multiple Live Births   0 0 0 0 0         Patient Active Problem List   Diagnosis Date Noted  . 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/02/2016    Past Medical History:  Diagnosis Date  . Constipation   . Elevated hemoglobin A1c 12/12/2016   5.7  . Fibroids   . GERD (gastroesophageal reflux disease)   . Patient is Jehovah's Witness   . Seasonal allergies     Past Surgical History:  Procedure Laterality Date  . BREAST SURGERY     breast reduction  . COLONOSCOPY    . CYSTOSCOPY N/A 12/02/2016   Procedure: CYSTOSCOPY;  Surgeon: Nunzio Cobbs, MD;  Location: [facility];  Service: Gynecology;  Laterality: N/A;  . ESOPHAGOGASTRODUODENOSCOPY ENDOSCOPY    . HYSTERECTOMY ABDOMINAL WITH SALPINGECTOMY Bilateral 12/02/2016   Procedure: HYSTERECTOMY ABDOMINAL WITH SALPINGECTOMY;  Surgeon: Nunzio Cobbs, MD;  Location: [facility];  Service: Gynecology;  Laterality: Bilateral;  possible TAH due to large fibroids  . IR GENERIC HISTORICAL  05/07/2016   IR RADIOLOGIST EVAL & MGMT 05/07/2016 Greggory Keen, MD [facility] INTERV RAD  . MYOMECTOMY  11/29/2009   Dr.Thurl Boen Silva - abdominal myomectomy  . WISDOM TOOTH EXTRACTION      Current Outpatient Prescriptions  Medication Sig Dispense  Refill  . Ascorbic Acid (VITAMIN C PO) Take 1 tablet by mouth daily.    . Cholecalciferol (VITAMIN D PO) Take 1 tablet by mouth daily.    . fluticasone (FLONASE) 50 MCG/ACT nasal spray Place 1 spray into both nostrils daily as needed for allergies or rhinitis.    Marland Kitchen ibuprofen (ADVIL,MOTRIN) 200 MG tablet Take 600-800 mg by mouth every 8 (eight) hours as needed for mild pain (depends on pain if takes 3-4 tablets).     . Omega-3 Fatty Acids (FISH OIL PO) Take 1 capsule by mouth daily.    . Probiotic Product (PROBIOTIC PO) Take 1 tablet by mouth daily.    . RaNITidine HCl (ZANTAC PO) Take 1 tablet by mouth daily as needed (indigestion).     No current facility-administered medications for this visit.      ALLERGIES: Patient has no known allergies.  Family History  Problem Relation Age of Onset  . Diabetes Mother   . Thyroid disease Mother        hyperthyroid  . Diabetes Father   . Hypertension Father   . Hypertension Sister   . Breast cancer Maternal Aunt 48       A & W  . Stroke Paternal Grandmother   . Hypertension Sister     Social History   Social History  . Marital status: Married  Spouse name: N/A  . Number of children: N/A  . Years of education: N/A   Occupational History  . Not on file.   Social History Main Topics  . Smoking status: Never Smoker  . Smokeless tobacco: Never Used  . Alcohol use 2.4 oz/week    2 Glasses of wine, 2 Shots of liquor per week  . Drug use: No  . Sexual activity: Yes    Partners: Male    Birth control/ protection: Surgical     Comment: Hysterectomy   Other Topics Concern  . Not on file   Social History Narrative  . No narrative on file    ROS:  Pertinent items are noted in HPI.  PHYSICAL EXAMINATION:    BP 136/78 (BP Location: Right Arm, Patient Position: Sitting, Cuff Size: Normal)   Pulse 84   Resp 16   Wt 200 lb 12.8 oz (91.1 kg)   LMP 06/24/2016   BMI 32.41 kg/m     General appearance: alert, cooperative and  appears stated age   Abdomen: Pfannenstiel incision intact, soft, non-tender, no masses,  no organomegaly    Pelvic: External genitalia:  no lesions              Urethra:  normal appearing urethra with no masses, tenderness or lesions              Bartholins and Skenes: normal                 Vagina: normal appearing vagina with normal color and discharge, no lesions              Cervix:  Absent.  Cuff well healed.                Bimanual Exam:  Uterus:  Absent.              Adnexa: no mass, fullness, tenderness                Chaperone was present for exam.  ASSESSMENT  Status post TAH/bilateral salpingectomy/cystoscopy.  Elevated Hgb A1C   PLAN  Return to all normal activity - work, sex, exercise.  Return for annual exams every June around time of surgery.  [facility] We discussed her elevated blood sugar and ways to reduce this.  She will establish care with PCP group, likely  at [facility].   An After Visit Summary was printed and given to the patient.  ______ minutes face to face time of which over 50% was spent in counseling.

## 2017-01-23 DIAGNOSIS — L668 Other cicatricial alopecia: Secondary | ICD-10-CM | POA: Insufficient documentation

## 2017-01-23 DIAGNOSIS — L219 Seborrheic dermatitis, unspecified: Secondary | ICD-10-CM | POA: Insufficient documentation

## 2017-11-27 ENCOUNTER — Ambulatory Visit: Payer: 59 | Admitting: Obstetrics and Gynecology

## 2017-12-04 ENCOUNTER — Ambulatory Visit: Payer: 59 | Admitting: Obstetrics and Gynecology

## 2017-12-04 ENCOUNTER — Other Ambulatory Visit: Payer: Self-pay

## 2017-12-04 ENCOUNTER — Encounter: Payer: Self-pay | Admitting: Obstetrics and Gynecology

## 2017-12-04 VITALS — BP 148/94 | HR 86 | Resp 14 | Ht 66.0 in | Wt 187.0 lb

## 2017-12-04 DIAGNOSIS — Z01419 Encounter for gynecological examination (general) (routine) without abnormal findings: Secondary | ICD-10-CM

## 2017-12-04 DIAGNOSIS — Z23 Encounter for immunization: Secondary | ICD-10-CM

## 2017-12-04 NOTE — Progress Notes (Signed)
42 y.o. G0P0000 Married [place] American female here for annual exam.    Happy to have had hysterectomy.  No more bleeding and no more pain.   Had labs at urgent care in November.  Has hx of elevated hemoglobin A1C, but it returned to normal.  Cholesterol was normal. States BP is usually good.   Has BP monitor at home.   PCP:   None per patient   Patient's last menstrual period was 06/24/2016.           Sexually active: Yes.    The current method of family planning is status post hysterectomy.    Exercising: Yes.    cardio  Smoker:  no  Health Maintenance: Pap:  09-18-16 negative, HR HPV negative, 04-12-11 negative  History of abnormal Pap:  no MMG:  05-01-16 negative/density b/ [facility]. Colonoscopy:  6/17- done by Dr. Michail Sermon BMD:   no  Result  n/a TDaP:  Due.  Gardasil:   no Screening Labs:  Hb today: discuss with provider, Urine today: not collected    reports that she has never smoked. She has never used smokeless tobacco. She reports that she drinks about 2.4 oz of alcohol per week. She reports that she does not use drugs.  Past Medical History:  Diagnosis Date  . Constipation   . Elevated hemoglobin A1c 12/12/2016   5.7  . Fibroids   . GERD (gastroesophageal reflux disease)   . Patient is Jehovah's Witness   . Seasonal allergies     Past Surgical History:  Procedure Laterality Date  . BREAST SURGERY     breast reduction  . COLONOSCOPY    . CYSTOSCOPY N/A 12/02/2016   Procedure: CYSTOSCOPY;  Surgeon: Nunzio Cobbs, MD;  Location: [facility];  Service: Gynecology;  Laterality: N/A;  . ESOPHAGOGASTRODUODENOSCOPY ENDOSCOPY    . HYSTERECTOMY ABDOMINAL WITH SALPINGECTOMY Bilateral 12/02/2016   Procedure: HYSTERECTOMY ABDOMINAL WITH SALPINGECTOMY;  Surgeon: Nunzio Cobbs, MD;  Location: [facility];  Service: Gynecology;  Laterality: Bilateral;  possible TAH due to large fibroids  . IR GENERIC HISTORICAL  05/07/2016   IR RADIOLOGIST EVAL  & MGMT 05/07/2016 Greggory Keen, MD [facility] INTERV RAD  . MYOMECTOMY  11/29/2009   Dr.Malita Ignasiak Silva - abdominal myomectomy  . WISDOM TOOTH EXTRACTION      Current Outpatient Medications  Medication Sig Dispense Refill  . clobetasol ointment (TEMOVATE) 0.05 % APP EXT TO THE SCALP 3 TO 4 TIMES WEEKLY  3  . Probiotic Product (PROBIOTIC PO) Take by mouth.    . minoxidil (LONITEN) 2.5 MG tablet TK 1/2 T PO D  5   No current facility-administered medications for this visit.     Family History  Problem Relation Age of Onset  . Diabetes Mother   . Thyroid disease Mother        hyperthyroid  . Diabetes Father   . Hypertension Father   . Hypertension Sister   . Breast cancer Maternal Aunt 67       A & W  . Stroke Paternal Grandmother   . Hypertension Sister     Review of Systems  Constitutional: Negative.   HENT: Negative.   Eyes: Negative.   Respiratory: Negative.   Cardiovascular: Negative.   Gastrointestinal: Positive for constipation.  Endocrine: Negative.   Genitourinary: Negative.   Musculoskeletal: Negative.   Allergic/Immunologic: Negative.   Neurological: Negative.   Hematological: Negative.   Psychiatric/Behavioral: Negative.     Exam:   BP Marland Kitchen)  148/94 (BP Location: Right Arm, Patient Position: Sitting, Cuff Size: Normal)   Pulse 86   Resp 14   Ht 5\' 6"  (1.676 m)   Wt 187 lb (84.8 kg)   LMP 06/24/2016   BMI 30.18 kg/m     General appearance: alert, cooperative and appears stated age Head: Normocephalic, without obvious abnormality, atraumatic Neck: no adenopathy, supple, symmetrical, trachea midline and thyroid normal to inspection and palpation Lungs: clear to auscultation bilaterally Breasts: normal appearance, no masses or tenderness, No nipple retraction or dimpling, No nipple discharge or bleeding, No axillary or supraclavicular adenopathy Heart: regular rate and rhythm Abdomen: soft, non-tender; no masses, no organomegaly Extremities: extremities normal,  atraumatic, no cyanosis or edema Skin: Skin color, texture, turgor normal. No rashes or lesions Lymph nodes: Cervical, supraclavicular, and axillary nodes normal. No abnormal inguinal nodes palpated Neurologic: Grossly normal  Pelvic: External genitalia:  no lesions              Urethra:  normal appearing urethra with no masses, tenderness or lesions              Bartholins and Skenes: normal                 Vagina: normal appearing vagina with normal color and discharge, no lesions              Cervix: absent              Pap taken: No. Bimanual Exam:  Uterus:  absent              Adnexa: no mass, fullness, tenderness              Rectal exam: Yes.  .  Confirms.              Anus:  normal sphincter tone, no lesions  Chaperone was present for exam.  Assessment:   Well woman visit with normal exam. Status post TAH/bilateral salpingectomy.  Elevated BP today.  Plan: Mammogram screening recommended.  Recommended self breast awareness. Pap and HR HPV as above. Guidelines for Calcium, Vitamin D, regular exercise program including cardiovascular and weight bearing exercise. TDap.  Discussed Gardasil. She will monitor her BP at home and see a PCP if remains elevated 140/90 range or higher. Follow up annually and prn.   After visit summary provided.

## 2017-12-04 NOTE — Patient Instructions (Signed)

## 2017-12-05 ENCOUNTER — Other Ambulatory Visit: Payer: Self-pay | Admitting: Obstetrics and Gynecology

## 2017-12-05 DIAGNOSIS — Z1231 Encounter for screening mammogram for malignant neoplasm of breast: Secondary | ICD-10-CM

## 2018-01-19 ENCOUNTER — Ambulatory Visit
Admission: RE | Admit: 2018-01-19 | Discharge: 2018-01-19 | Disposition: A | Discharge status: Home / Self Care | Payer: 59 | Source: Ambulatory Visit | Attending: Obstetrics and Gynecology | Admitting: Obstetrics and Gynecology

## 2018-01-19 DIAGNOSIS — Z1231 Encounter for screening mammogram for malignant neoplasm of breast: Secondary | ICD-10-CM

## 2018-11-12 ENCOUNTER — Telehealth: Payer: Self-pay | Admitting: Obstetrics and Gynecology

## 2018-11-12 NOTE — Telephone Encounter (Signed)
Left message on voicemail to call and reschedule cancelled appointment. °

## 2018-12-25 ENCOUNTER — Ambulatory Visit: Payer: 59 | Admitting: Obstetrics and Gynecology

## 2019-01-18 ENCOUNTER — Other Ambulatory Visit: Payer: Self-pay

## 2019-01-18 NOTE — Progress Notes (Signed)
43 y.o. G0P0000 Married [place] American female here for annual exam.    No pelvic pain.  No hot flashes.   Working from home.  PCP: Precious Gilding, PA-C   Patient's last menstrual period was 06/24/2016.           Sexually active: Yes.    The current method of family planning is status post hysterectomy.    Exercising: Yes.    cardio and weights Smoker:  no  Health Maintenance: Pap: 09-18-16 Neg:Neg HR HPV History of abnormal Pap:  no MMG: 01-19-18 3D Neg/density b/BiRads1.   Colonoscopy:  11/2015 normal.  Constipation issues.  BMD:   n/a  Result  n/a TDaP:  12-04-17 Gardasil:   no HIV:no Hep C:no Screening Labs:  ---   reports that she has never smoked. She has never used smokeless tobacco. She reports current alcohol use of about 4.0 standard drinks of alcohol per week. She reports that she does not use drugs.  Past Medical History:  Diagnosis Date  . Constipation   . Elevated hemoglobin A1c 12/12/2016   5.7  . Fibroids   . GERD (gastroesophageal reflux disease)   . Patient is Jehovah's Witness   . Seasonal allergies     Past Surgical History:  Procedure Laterality Date  . BREAST SURGERY     breast reduction  . COLONOSCOPY    . CYSTOSCOPY N/A 12/02/2016   Procedure: CYSTOSCOPY;  Surgeon: Nunzio Cobbs, MD;  Location: [facility];  Service: Gynecology;  Laterality: N/A;  . ESOPHAGOGASTRODUODENOSCOPY ENDOSCOPY    . HYSTERECTOMY ABDOMINAL WITH SALPINGECTOMY Bilateral 12/02/2016   Procedure: HYSTERECTOMY ABDOMINAL WITH SALPINGECTOMY;  Surgeon: Nunzio Cobbs, MD;  Location: [facility];  Service: Gynecology;  Laterality: Bilateral;  possible TAH due to large fibroids  . IR GENERIC HISTORICAL  05/07/2016   IR RADIOLOGIST EVAL & MGMT 05/07/2016 Greggory Keen, MD [facility] INTERV RAD  . MYOMECTOMY  11/29/2009   Dr.Brook Silva - abdominal myomectomy  . REDUCTION MAMMAPLASTY Bilateral 2000  . WISDOM TOOTH EXTRACTION      Current Outpatient Medications   Medication Sig Dispense Refill  . LINZESS 145 MCG CAPS capsule Take 145 mcg by mouth daily.     No current facility-administered medications for this visit.     Family History  Problem Relation Age of Onset  . Diabetes Mother   . Thyroid disease Mother        hyperthyroid  . Diabetes Father   . Hypertension Father   . Hypertension Sister   . Breast cancer Maternal Aunt 46       A & W  . Stroke Paternal Grandmother   . Hypertension Sister     Review of Systems  All other systems reviewed and are negative.   Exam:   BP 122/80   Pulse 70   Temp (!) 97.2 F (36.2 C) (Temporal)   Resp 16   Ht 5\' 6"  (1.676 m)   Wt 171 lb (77.6 kg)   LMP 06/24/2016   BMI 27.60 kg/m     General appearance: alert, cooperative and appears stated age Head: normocephalic, without obvious abnormality, atraumatic Neck: no adenopathy, supple, symmetrical, trachea midline and thyroid normal to inspection and palpation Lungs: clear to auscultation bilaterally Breasts: normal appearance, no masses or tenderness, No nipple retraction or dimpling, No nipple discharge or bleeding, No axillary adenopathy Heart: regular rate and rhythm Abdomen: soft, non-tender; no masses, no organomegaly Extremities: extremities normal, atraumatic, no cyanosis or edema Skin:  skin color, texture, turgor normal. No rashes or lesions Lymph nodes: cervical, supraclavicular, and axillary nodes normal. Neurologic: grossly normal  Pelvic: External genitalia:  no lesions              No abnormal inguinal nodes palpated.              Urethra:  normal appearing urethra with no masses, tenderness or lesions              Bartholins and Skenes: normal                 Vagina: normal appearing vagina with normal color and discharge, no lesions              Cervix: absent              Pap taken: No. Bimanual Exam:  Uterus:  absent              Adnexa: no mass, fullness, tenderness              Rectal exam: Yes.  .  Confirms.               Anus:  normal sphincter tone, no lesions  Chaperone was present for exam.  Assessment:   Well woman visit with normal exam. Status post TAH/bilateral salpingectomy.  Colon cancer screening.   Plan: Mammogram screening discussed.  She will schedule.  Self breast awareness reviewed. Pap and HR HPV as above. Guidelines for Calcium, Vitamin D, regular exercise program including cardiovascular and weight bearing exercise. Labs with PCP. IFOB.  Follow up annually and prn.   After visit summary provided.

## 2019-01-20 ENCOUNTER — Other Ambulatory Visit: Payer: Self-pay

## 2019-01-20 ENCOUNTER — Ambulatory Visit: Payer: 59 | Admitting: Obstetrics and Gynecology

## 2019-01-20 ENCOUNTER — Encounter: Payer: Self-pay | Admitting: Obstetrics and Gynecology

## 2019-01-20 VITALS — BP 122/80 | HR 70 | Temp 97.2°F | Resp 16 | Ht 66.0 in | Wt 171.0 lb

## 2019-01-20 DIAGNOSIS — Z1211 Encounter for screening for malignant neoplasm of colon: Secondary | ICD-10-CM | POA: Diagnosis not present

## 2019-01-20 DIAGNOSIS — Z01419 Encounter for gynecological examination (general) (routine) without abnormal findings: Secondary | ICD-10-CM

## 2019-01-20 NOTE — Patient Instructions (Signed)

## 2019-03-04 ENCOUNTER — Other Ambulatory Visit: Payer: Self-pay | Admitting: Obstetrics and Gynecology

## 2019-03-04 DIAGNOSIS — Z1231 Encounter for screening mammogram for malignant neoplasm of breast: Secondary | ICD-10-CM

## 2019-04-21 ENCOUNTER — Ambulatory Visit
Admission: RE | Admit: 2019-04-21 | Discharge: 2019-04-21 | Disposition: A | Discharge status: Home / Self Care | Payer: 59 | Source: Ambulatory Visit | Attending: Obstetrics and Gynecology | Admitting: Obstetrics and Gynecology

## 2019-04-21 ENCOUNTER — Other Ambulatory Visit: Payer: Self-pay

## 2019-04-21 DIAGNOSIS — Z1231 Encounter for screening mammogram for malignant neoplasm of breast: Secondary | ICD-10-CM

## 2019-05-18 ENCOUNTER — Ambulatory Visit (INDEPENDENT_AMBULATORY_CARE_PROVIDER_SITE_OTHER): Payer: 59 | Admitting: Obstetrics and Gynecology

## 2019-05-18 ENCOUNTER — Other Ambulatory Visit: Payer: Self-pay

## 2019-05-18 ENCOUNTER — Encounter: Payer: Self-pay | Admitting: Obstetrics and Gynecology

## 2019-05-18 VITALS — BP 120/82 | HR 80 | Temp 97.3°F | Ht 66.0 in | Wt 174.6 lb

## 2019-05-18 DIAGNOSIS — R300 Dysuria: Secondary | ICD-10-CM | POA: Diagnosis not present

## 2019-05-18 DIAGNOSIS — N3090 Cystitis, unspecified without hematuria: Secondary | ICD-10-CM

## 2019-05-18 DIAGNOSIS — R319 Hematuria, unspecified: Secondary | ICD-10-CM

## 2019-05-18 LAB — POCT URINALYSIS DIPSTICK
Bilirubin, UA: NEGATIVE
Glucose, UA: NEGATIVE
Ketones, UA: NEGATIVE
Nitrite, UA: POSITIVE
Protein, UA: POSITIVE — AB
Urobilinogen, UA: NEGATIVE E.U./dL — AB
pH, UA: 5 (ref 5.0–8.0)

## 2019-05-18 MED ORDER — SULFAMETHOXAZOLE-TRIMETHOPRIM 800-160 MG PO TABS: 1.0000 | ORAL_TABLET | Freq: Two times a day (BID) | ORAL | 0 refills | Status: DC

## 2019-05-18 MED ORDER — PHENAZOPYRIDINE HCL 200 MG PO TABS: 200.0000 mg | ORAL_TABLET | Freq: Three times a day (TID) | ORAL | 0 refills | Status: DC | PRN

## 2019-05-18 NOTE — Patient Instructions (Signed)
Urinary Tract Infection, Adult A urinary tract infection (UTI) is an infection of any part of the urinary tract. The urinary tract includes:  The kidneys.  The ureters.  The bladder.  The urethra. These organs make, store, and get rid of pee (urine) in the body. What are the causes? This is caused by germs (bacteria) in your genital area. These germs grow and cause swelling (inflammation) of your urinary tract. What increases the risk? You are more likely to develop this condition if:  You have a small, thin tube (catheter) to drain pee.  You cannot control when you pee or poop (incontinence).  You are female, and: ? You use these methods to prevent pregnancy: ? A medicine that kills sperm (spermicide). ? A device that blocks sperm (diaphragm). ? You have low levels of a female hormone (estrogen). ? You are pregnant.  You have genes that add to your risk.  You are sexually active.  You take antibiotic medicines.  You have trouble peeing because of: ? A prostate that is bigger than normal, if you are female. ? A blockage in the part of your body that drains pee from the bladder (urethra). ? A kidney stone. ? A nerve condition that affects your bladder (neurogenic bladder). ? Not getting enough to drink. ? Not peeing often enough.  You have other conditions, such as: ? Diabetes. ? A weak disease-fighting system (immune system). ? Sickle cell disease. ? Gout. ? Injury of the spine. What are the signs or symptoms? Symptoms of this condition include:  Needing to pee right away (urgently).  Peeing often.  Peeing small amounts often.  Pain or burning when peeing.  Blood in the pee.  Pee that smells bad or not like normal.  Trouble peeing.  Pee that is cloudy.  Fluid coming from the vagina, if you are female.  Pain in the belly or lower back. Other symptoms include:  Throwing up (vomiting).  No urge to eat.  Feeling mixed up (confused).  Being tired  and grouchy (irritable).  A fever.  Watery poop (diarrhea). How is this treated? This condition may be treated with:  Antibiotic medicine.  Other medicines.  Drinking enough water. Follow these instructions at home:  Medicines  Take over-the-counter and prescription medicines only as told by your doctor.  If you were prescribed an antibiotic medicine, take it as told by your doctor. Do not stop taking it even if you start to feel better. General instructions  Make sure you: ? Pee until your bladder is empty. ? Do not hold pee for a long time. ? Empty your bladder after sex. ? Wipe from front to back after pooping if you are a female. Use each tissue one time when you wipe.  Drink enough fluid to keep your pee pale yellow.  Keep all follow-up visits as told by your doctor. This is important. Contact a doctor if:  You do not get better after 1-2 days.  Your symptoms go away and then come back. Get help right away if:  You have very bad back pain.  You have very bad pain in your lower belly.  You have a fever.  You are sick to your stomach (nauseous).  You are throwing up. Summary  A urinary tract infection (UTI) is an infection of any part of the urinary tract.  This condition is caused by germs in your genital area.  There are many risk factors for a UTI. These include having a small, thin   tube to drain pee and not being able to control when you pee or poop.  Treatment includes antibiotic medicines for germs.  Drink enough fluid to keep your pee pale yellow. This information is not intended to replace advice given to you by your health care provider. Make sure you discuss any questions you have with your health care provider. Document Released: 11/13/2007 Document Revised: 05/14/2018 Document Reviewed: 12/04/2017 Elsevier Patient Education  2020 Elsevier Inc.  

## 2019-05-18 NOTE — Progress Notes (Signed)
GYNECOLOGY  VISIT   HPI: 43 y.o.   Married  [place] American  female   [place] with Patient's last menstrual period was 06/24/2016.   here for urinary frequency, dysuria and hematuria.  Feeling a heaviness.  Feels constipated.  Taking Linzess for 2 - 3 years.  It has gotten too expensive, and she only has about 4 pills left.   Working from home right now.   Urine Dip: Mod.RBCs, Mod.WBCs, Pos.Nitrites  GYNECOLOGIC HISTORY: Patient's last menstrual period was 06/24/2016. Contraception:  Hysterectomy Menopausal hormone therapy: none Last mammogram: : 04-21-19 3D/Neg/density B/BiRads1 Last pap smear: 09-18-16 Neg:Neg HR HPV, 04-27-13 Neg:Neg HR HPV, 04-12-11 Neg        OB History    Gravida  0   Para  0   Term  0   Preterm  0   AB  0   Living  0     SAB  0   TAB  0   Ectopic  0   Multiple  0   Live Births  0              Patient Active Problem List   Diagnosis Date Noted  . 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/02/2016    Past Medical History:  Diagnosis Date  . Constipation   . Elevated hemoglobin A1c 12/12/2016   5.7  . Fibroids   . GERD (gastroesophageal reflux disease)   . Patient is Jehovah's Witness   . Seasonal allergies     Past Surgical History:  Procedure Laterality Date  . BREAST SURGERY     breast reduction  . COLONOSCOPY    . CYSTOSCOPY N/A 12/02/2016   Procedure: CYSTOSCOPY;  Surgeon: Nunzio Cobbs, MD;  Location: [facility];  Service: Gynecology;  Laterality: N/A;  . ESOPHAGOGASTRODUODENOSCOPY ENDOSCOPY    . HYSTERECTOMY ABDOMINAL WITH SALPINGECTOMY Bilateral 12/02/2016   Procedure: HYSTERECTOMY ABDOMINAL WITH SALPINGECTOMY;  Surgeon: Nunzio Cobbs, MD;  Location: [facility];  Service: Gynecology;  Laterality: Bilateral;  possible TAH due to large fibroids  . IR GENERIC HISTORICAL  05/07/2016   IR RADIOLOGIST EVAL & MGMT 05/07/2016 Greggory Keen, MD [facility] INTERV RAD  . MYOMECTOMY  11/29/2009   Dr.Brook  Silva - abdominal myomectomy  . REDUCTION MAMMAPLASTY Bilateral 2000  . WISDOM TOOTH EXTRACTION      Current Outpatient Medications  Medication Sig Dispense Refill  . LINZESS 145 MCG CAPS capsule Take 145 mcg by mouth daily.     No current facility-administered medications for this visit.      ALLERGIES: Patient has no known allergies.  Family History  Problem Relation Age of Onset  . Diabetes Mother   . Thyroid disease Mother        hyperthyroid  . Diabetes Father   . Hypertension Father   . Hypertension Sister   . Breast cancer Maternal Aunt 73       A & W  . Stroke Paternal Grandmother   . Hypertension Sister     Social History   Socioeconomic History  . Marital status: Married    Spouse name: Not on file  . Number of children: Not on file  . Years of education: Not on file  . Highest education level: Not on file  Occupational History  . Not on file  Social Needs  . Financial resource strain: Not on file  . Food insecurity    Worry: Not on file    Inability: Not on file  .  Transportation needs    Medical: Not on file    Non-medical: Not on file  Tobacco Use  . Smoking status: Never Smoker  . Smokeless tobacco: Never Used  Substance and Sexual Activity  . Alcohol use: Yes    Alcohol/week: 4.0 standard drinks    Types: 2 Glasses of wine, 2 Shots of liquor per week  . Drug use: No  . Sexual activity: Yes    Partners: Male    Birth control/protection: Surgical    Comment: Hysterectomy  Lifestyle  . Physical activity    Days per week: Not on file    Minutes per session: Not on file  . Stress: Not on file  Relationships  . Social Herbalist on phone: Not on file    Gets together: Not on file    Attends religious service: Not on file    Active member of club or organization: Not on file    Attends meetings of clubs or organizations: Not on file    Relationship status: Not on file  . Intimate partner violence    Fear of current or ex  partner: Not on file    Emotionally abused: Not on file    Physically abused: Not on file    Forced sexual activity: Not on file  Other Topics Concern  . Not on file  Social History Narrative  . Not on file    Review of Systems  Genitourinary: Positive for dysuria, frequency and hematuria.  All other systems reviewed and are negative.   PHYSICAL EXAMINATION:    BP 120/82   Pulse 80   Temp (!) 97.3 F (36.3 C) (Temporal)   Ht 5\' 6"  (1.676 m)   Wt 174 lb 9.6 oz (79.2 kg)   LMP 06/24/2016   BMI 28.18 kg/m     General appearance: alert, cooperative and appears stated age   ASSESSMENT  Cystitis.   Constipation.  Status post hysterectomy.   PLAN  Urine micro and culture.  Bactrim DS po bid x 3 days.  Pyridium 200 mg po bid x 2 - 3 days. Hydrate well.  I reviewed signs of pyelonephritis.  Call if no improvement in 48 hours.  She will contact her provider about an alternative to the [place].   An After Visit Summary was printed and given to the patient.  _15____ minutes face to face time of which over 50% was spent in counseling.

## 2019-05-19 LAB — URINALYSIS, MICROSCOPIC ONLY
Casts: NONE SEEN /lpf
RBC: >30 /hpf — AB (ref 0–2)
WBC, UA: >30 /hpf — AB (ref 0–5)

## 2019-05-20 LAB — URINE CULTURE

## 2019-05-21 ENCOUNTER — Telehealth: Payer: Self-pay | Admitting: Obstetrics and Gynecology

## 2019-05-21 NOTE — Telephone Encounter (Signed)
Spoke with patient, advised per Dr. Talbert Nan. Patient states she is feeling better, symptoms have resolved. Patient verbalizes understanding and is agreeable.   Routing to provider for final review. Patient is agreeable to disposition. Will close encounter.  Cc: Dr. Quincy Simmonds

## 2019-05-21 NOTE — Telephone Encounter (Signed)
Routing to covering provider to review 05/18/19 UC results.   Dr. Talbert Nan -please review and advise.

## 2019-05-21 NOTE — Telephone Encounter (Signed)
Patient calling to check if results have come in.

## 2019-05-21 NOTE — Telephone Encounter (Signed)
Please let her know that she does have a UTI and is on the correct medication. Please see if she is feeling better.

## 2019-10-11 ENCOUNTER — Other Ambulatory Visit: Payer: Self-pay

## 2019-10-11 ENCOUNTER — Ambulatory Visit (INDEPENDENT_AMBULATORY_CARE_PROVIDER_SITE_OTHER): Payer: PRIVATE HEALTH INSURANCE | Admitting: Physician Assistant

## 2019-10-11 ENCOUNTER — Encounter: Payer: Self-pay | Admitting: Physician Assistant

## 2019-10-11 DIAGNOSIS — L308 Other specified dermatitis: Secondary | ICD-10-CM | POA: Diagnosis not present

## 2019-10-11 DIAGNOSIS — L814 Other melanin hyperpigmentation: Secondary | ICD-10-CM

## 2019-10-11 MED ORDER — TRIAMCINOLONE ACETONIDE 0.1 % EX CREA: 1.0000 "application " | TOPICAL_CREAM | Freq: Every evening | CUTANEOUS | 2 refills | Status: AC

## 2019-10-11 NOTE — Progress Notes (Signed)
   Follow up Visit  Subjective  Penny Edwards is a 44 y.o. AA female who presents for the following: Skin Problem (Patient here today for discoloration on her nose x 2-3 weeks and dry patches on right lower leg and upper left arm x 2 months.  Patient hasn't tried anything for these issues.). Tiny cluster of small bumps left upper arm. Comes and goes for the past 6 months comes and goes every couple weeks. Occasionally it itches. Also a patch on the right shin which itches quite a bit and looks dry. This one started small about 2 months ago but has grown in the last 2 weeks. Also  Over the end of her nose seemed darker. She noticed that in the last couple weeks.  She did not peel and the nose never felt hot or sensitive. It has not seemed puffy and the texture has not changed.   Objective  Well appearing patient in no apparent distress; mood and affect are within normal limits.  A focused examination was performed including arms, legs and face.. Relevant physical exam findings are noted in the Assessment and Plan.   Objective  Left Upper Arm - Anterior, Right Lower Leg - Anterior: Left shin show small erythematous slightly lichenified patch. Left upper arm shows cluster of tiny papules in a small area.   Images    Objective  Mid Tip of Nose: Patient states the end of her nose is hyperpigmented. If so it is very subtle. There seems to be no underlying inflammation or any papules and no texture change is noted.   Assessment & Plan  Other eczema (2) Left Upper Arm - Anterior; Right Lower Leg - Anterior  triamcinolone cream (KENALOG) 0.1 % - Left Upper Arm - Anterior, Right Lower Leg - Anterior  Other melanin hyperpigmentation Mid Tip of Nose  Continue to watch this for now. Discussed inflammation and signs of inflammation. Also discussed watching for texture changes or bumps. She is to call if she notices any of these things or the hyperpigmentation worsens.

## 2019-10-13 ENCOUNTER — Encounter: Payer: Self-pay | Admitting: Physician Assistant

## 2020-04-17 DIAGNOSIS — Q181 Preauricular sinus and cyst: Secondary | ICD-10-CM | POA: Insufficient documentation

## 2020-10-16 ENCOUNTER — Other Ambulatory Visit: Payer: Self-pay | Admitting: Obstetrics and Gynecology

## 2020-10-16 DIAGNOSIS — Z1231 Encounter for screening mammogram for malignant neoplasm of breast: Secondary | ICD-10-CM

## 2020-10-18 ENCOUNTER — Ambulatory Visit
Admission: RE | Admit: 2020-10-18 | Discharge: 2020-10-18 | Disposition: A | Discharge status: Home / Self Care | Payer: BC Managed Care – PPO | Source: Ambulatory Visit | Attending: Obstetrics and Gynecology | Admitting: Obstetrics and Gynecology

## 2020-10-18 ENCOUNTER — Other Ambulatory Visit: Payer: Self-pay

## 2020-10-18 DIAGNOSIS — Z1231 Encounter for screening mammogram for malignant neoplasm of breast: Secondary | ICD-10-CM

## 2021-03-09 ENCOUNTER — Telehealth (HOSPITAL_BASED_OUTPATIENT_CLINIC_OR_DEPARTMENT_OTHER): Payer: Self-pay | Admitting: Obstetrics & Gynecology

## 2021-03-09 MED ORDER — SULFAMETHOXAZOLE-TRIMETHOPRIM 800-160 MG PO TABS: 1.0000 | ORAL_TABLET | Freq: Two times a day (BID) | ORAL | 0 refills | Status: DC

## 2021-03-09 NOTE — Telephone Encounter (Signed)
Pt called today stating she has symptoms c/w UTI.  Is overdue for appt.  Called to see if she could be seen but office closed early due to weather.  Having dysuria, urgency, frequency.  Last OV was 05/2019 when she had a UTI.  Grew E coli. Last pap was 2018.  Rx for bactrim DS BID x 3 days to pharmacy.  Pt is not having any fever or back pain.  Reasons for seeking emergency care discussed.  States she will call back next week to make appt.

## 2021-05-02 ENCOUNTER — Ambulatory Visit: Payer: 59 | Admitting: Obstetrics and Gynecology

## 2021-05-29 NOTE — Progress Notes (Deleted)
45 y.o. G0P0000 Married [place] American female here for annual exam.    PCP:     Patient's last menstrual period was 06/24/2016.           Sexually active: {yes no:314532}  The current method of family planning is status post hysterectomy.    Exercising: {yes no:314532}  {types:19826} Smoker:  no  Health Maintenance: Pap:  09-18-16 Neg:Neg HR HPV, 04-12-11 Neg History of abnormal Pap:  no MMG:  10-18-20 Neg/BiRads1 Colonoscopy:  11/2015 BMD:   n/a  Result  n/a TDaP:  12-04-17 Gardasil:   no HIV:*** Hep C:*** Screening Labs:  Hb today: ***, Urine today: ***   reports that she has never smoked. She has never used smokeless tobacco. She reports current alcohol use of about 4.0 standard drinks per week. She reports that she does not use drugs.  Past Medical History:  Diagnosis Date   Constipation    Elevated hemoglobin A1c 12/12/2016   5.7   Fibroids    GERD (gastroesophageal reflux disease)    Patient is Jehovah's Witness    Seasonal allergies     Past Surgical History:  Procedure Laterality Date   BREAST SURGERY     breast reduction   COLONOSCOPY     CYSTOSCOPY N/A 12/02/2016   Procedure: CYSTOSCOPY;  Surgeon: Nunzio Cobbs, MD;  Location: [facility];  Service: Gynecology;  Laterality: N/A;   ESOPHAGOGASTRODUODENOSCOPY ENDOSCOPY     HYSTERECTOMY ABDOMINAL WITH SALPINGECTOMY Bilateral 12/02/2016   Procedure: HYSTERECTOMY ABDOMINAL WITH SALPINGECTOMY;  Surgeon: Nunzio Cobbs, MD;  Location: [facility];  Service: Gynecology;  Laterality: Bilateral;  possible TAH due to large fibroids   IR GENERIC HISTORICAL  05/07/2016   IR RADIOLOGIST EVAL & MGMT 05/07/2016 Greggory Keen, MD [facility] INTERV RAD   MYOMECTOMY  11/29/2009   Dr.Brook [facility] Simmonds - abdominal myomectomy   REDUCTION MAMMAPLASTY Bilateral 2000   WISDOM TOOTH EXTRACTION      Current Outpatient Medications  Medication Sig Dispense Refill   LINZESS 145 MCG CAPS capsule Take 145 mcg by mouth daily.      sulfamethoxazole-trimethoprim (BACTRIM DS) 800-160 MG tablet Take 1 tablet by mouth 2 (two) times daily. 6 tablet 0   topiramate (TOPAMAX) 25 MG tablet Take 25 mg by mouth daily.     triamcinolone cream (KENALOG) 0.1 % Apply 1 application topically every evening. 80 g 2   No current facility-administered medications for this visit.    Family History  Problem Relation Age of Onset   Diabetes Mother    Thyroid disease Mother        hyperthyroid   Diabetes Father    Hypertension Father    Hypertension Sister    Breast cancer Maternal Aunt 27       A & W   Stroke Paternal Grandmother    Hypertension Sister     Review of Systems  Exam:   LMP 06/24/2016     General appearance: alert, cooperative and appears stated age Head: normocephalic, without obvious abnormality, atraumatic Neck: no adenopathy, supple, symmetrical, trachea midline and thyroid normal to inspection and palpation Lungs: clear to auscultation bilaterally Breasts: normal appearance, no masses or tenderness, No nipple retraction or dimpling, No nipple discharge or bleeding, No axillary adenopathy Heart: regular rate and rhythm Abdomen: soft, non-tender; no masses, no organomegaly Extremities: extremities normal, atraumatic, no cyanosis or edema Skin: skin color, texture, turgor normal. No rashes or lesions Lymph nodes: cervical, supraclavicular, and axillary nodes normal. Neurologic: grossly  normal  Pelvic: External genitalia:  no lesions              No abnormal inguinal nodes palpated.              Urethra:  normal appearing urethra with no masses, tenderness or lesions              Bartholins and Skenes: normal                 Vagina: normal appearing vagina with normal color and discharge, no lesions              Cervix: no lesions              Pap taken: {yes no:314532} Bimanual Exam:  Uterus:  normal size, contour, position, consistency, mobility, non-tender              Adnexa: no mass, fullness,  tenderness              Rectal exam: {yes no:314532}.  Confirms.              Anus:  normal sphincter tone, no lesions  Chaperone was present for exam:  ***  Assessment:   Well woman visit with gynecologic exam.   Plan: Mammogram screening discussed. Self breast awareness reviewed. Pap and HR HPV as above. Guidelines for Calcium, Vitamin D, regular exercise program including cardiovascular and weight bearing exercise.   Follow up annually and prn.   Additional counseling given.  {yes [id number]. _______ minutes face to face time of which over 50% was spent in counseling.    After visit summary provided.

## 2021-05-31 ENCOUNTER — Ambulatory Visit: Payer: 59 | Admitting: Obstetrics and Gynecology

## 2021-06-12 ENCOUNTER — Other Ambulatory Visit: Payer: Self-pay

## 2021-06-12 ENCOUNTER — Encounter: Payer: Self-pay | Admitting: Obstetrics and Gynecology

## 2021-06-12 ENCOUNTER — Ambulatory Visit (INDEPENDENT_AMBULATORY_CARE_PROVIDER_SITE_OTHER): Payer: BC Managed Care – PPO | Admitting: Obstetrics and Gynecology

## 2021-06-12 VITALS — BP 118/68 | HR 70 | Ht 66.0 in | Wt 181.0 lb

## 2021-06-12 DIAGNOSIS — Z01419 Encounter for gynecological examination (general) (routine) without abnormal findings: Secondary | ICD-10-CM | POA: Diagnosis not present

## 2021-06-12 NOTE — Progress Notes (Signed)
46 y.o. G0P0000 Married [place] American female here for annual exam.    Treated for a bladder infection with Bactrim DS 03/09/21 and then developed a yeast infection, treated with OTC Monistat medication.  She was on Amoxicillin when all of her symptoms started.   No current hot flashes.   Wants routine labs and Thyroid checked.   Doing consulting work.   PCP:   None.   Patient's last menstrual period was 06/24/2016.           Sexually active: Yes.    The current method of family planning is status post hysterectomy.    Exercising: No.  The patient does not participate in regular exercise at present. Smoker:  no  Health Maintenance: Pap:  09-18-16 Neg:neg HR HPV, 04-12-11 Neg History of abnormal Pap:  no MMG:  10-18-20 Neg/birads1 Colonoscopy:  2017 normal BMD:   n/a  Result  n/a TDaP:  12-04-17 Gardasil:   no HIV:no Hep C:no Screening Labs:  done at [facility].   reports that she has never smoked. She has never used smokeless tobacco. She reports current alcohol use of about 4.0 standard drinks per week. She reports that she does not use drugs.  Past Medical History:  Diagnosis Date   Constipation    Elevated hemoglobin A1c 12/12/2016   5.7   Fibroids    GERD (gastroesophageal reflux disease)    Low vitamin D level    Patient is Jehovah's Witness    Seasonal allergies     Past Surgical History:  Procedure Laterality Date   BREAST SURGERY     breast reduction   COLONOSCOPY     CYSTOSCOPY N/A 12/02/2016   Procedure: CYSTOSCOPY;  Surgeon: Nunzio Cobbs, MD;  Location: [facility];  Service: Gynecology;  Laterality: N/A;   ESOPHAGOGASTRODUODENOSCOPY ENDOSCOPY     HYSTERECTOMY ABDOMINAL WITH SALPINGECTOMY Bilateral 12/02/2016   Procedure: HYSTERECTOMY ABDOMINAL WITH SALPINGECTOMY;  Surgeon: Nunzio Cobbs, MD;  Location: [place] [facility];  Service: Gynecology;  Laterality: Bilateral;  possible TAH due to large fibroids   IR GENERIC HISTORICAL  05/07/2016    IR RADIOLOGIST EVAL & MGMT 05/07/2016 Greggory Keen, MD [facility] INTERV RAD   MYOMECTOMY  11/29/2009   Dr.Daniil Labarge [facility] Simmonds - abdominal myomectomy   REDUCTION MAMMAPLASTY Bilateral 2000   WISDOM TOOTH EXTRACTION      Current Outpatient Medications  Medication Sig Dispense Refill   LINZESS 145 MCG CAPS capsule Take 145 mcg by mouth daily.     triamcinolone cream (KENALOG) 0.1 % Apply 1 application topically every evening. 80 g 2   Vitamin D, Ergocalciferol, (DRISDOL) 1.25 MG (50000 UNIT) CAPS capsule Take 50,000 Units by mouth once a week.     No current facility-administered medications for this visit.    Family History  Problem Relation Age of Onset   Diabetes Mother    Thyroid disease Mother        hyperthyroid   Diabetes Father    Hypertension Father    Dementia Father    Hypertension Sister    Hypertension Sister    Breast cancer Maternal Aunt 37       A & W   Stroke Paternal Grandmother     Review of Systems  All other systems reviewed and are negative.  Exam:   BP 118/68    Pulse 70    Ht 5\' 6"  (1.676 m)    Wt 181 lb (82.1 kg)    LMP 06/24/2016  SpO2 98%    BMI 29.21 kg/m     General appearance: alert, cooperative and appears stated age Head: normocephalic, without obvious abnormality, atraumatic Neck: no adenopathy, supple, symmetrical, trachea midline and thyroid normal to inspection and palpation Lungs: clear to auscultation bilaterally Breasts: normal appearance, no masses or tenderness, No nipple retraction or dimpling, No nipple discharge or bleeding, No axillary adenopathy Heart: regular rate and rhythm Abdomen: soft, non-tender; no masses, no organomegaly Extremities: extremities normal, atraumatic, no cyanosis or edema Skin: skin color, texture, turgor normal. No rashes or lesions Lymph nodes: cervical, supraclavicular, and axillary nodes normal. Neurologic: grossly normal  Pelvic: External genitalia:  no lesions              No abnormal inguinal nodes  palpated.              Urethra:  normal appearing urethra with no masses, tenderness or lesions              Bartholins and Skenes: normal                 Vagina: normal appearing vagina with normal color and discharge, no lesions              Cervix: absent              Pap taken: no Bimanual Exam:  Uterus:  absent              Adnexa: no mass, fullness, tenderness              Rectal exam: yes.  Confirms.              Anus:  normal sphincter tone, no lesions  Chaperone was present for exam:  Estill Bamberg, CMA  Assessment:   Well woman visit with gynecologic exam. Status post TAH/bilateral salpingectomy.  Low vit D.  Plan: Mammogram screening discussed. Self breast awareness reviewed. Pap and HR HPV as above. Guidelines for Calcium, Vitamin D, regular exercise program including cardiovascular and weight bearing exercise. She will contact her GI to schedule her next colonoscopy.  Declines flu vaccine.  Follow up annually and prn.   After visit summary provided.

## 2021-06-12 NOTE — Patient Instructions (Signed)

## 2022-01-09 ENCOUNTER — Other Ambulatory Visit: Payer: Self-pay | Admitting: Obstetrics and Gynecology

## 2022-01-09 DIAGNOSIS — Z1231 Encounter for screening mammogram for malignant neoplasm of breast: Secondary | ICD-10-CM

## 2022-01-17 ENCOUNTER — Ambulatory Visit
Admission: RE | Admit: 2022-01-17 | Discharge: 2022-01-17 | Disposition: A | Discharge status: Home / Self Care | Payer: BC Managed Care – PPO | Source: Ambulatory Visit | Attending: Obstetrics and Gynecology | Admitting: Obstetrics and Gynecology

## 2022-01-17 DIAGNOSIS — Z1231 Encounter for screening mammogram for malignant neoplasm of breast: Secondary | ICD-10-CM

## 2022-08-05 NOTE — Progress Notes (Signed)
47 y.o. G0P0000 Married [place] American female here for annual exam.    Wants to know about menopause and have blood work today. Having mood swings and weight gain.  No hot flashes.  Some night sweats.   PCP:   Dr. Lindell Noe  Patient's last menstrual period was 06/24/2016.           Sexually active: Yes.    The current method of family planning is status post hysterectomy.    Exercising: Yes.     walking Smoker:  no  Health Maintenance: Pap:  09-18-16 Neg:neg HR HPV, 04-12-11 Neg  History of abnormal Pap:  no MMG:  01/17/22 Breast Density B, BI-RADS CATEGORY 1 Neg  Colonoscopy:  2017 normal BMD:   n/a  Result  n/a TDaP:  12/04/17 Gardasil:   no HIV: no Hep C: no Screening Labs:  PCP.   reports that she has never smoked. She has never used smokeless tobacco. She reports current alcohol use of about 4.0 standard drinks of alcohol per week. She reports that she does not use drugs.  Past Medical History:  Diagnosis Date   Constipation    Elevated hemoglobin A1c 12/12/2016   5.7   Fibroids    GERD (gastroesophageal reflux disease)    Low vitamin D level    Patient is Jehovah's Witness    Seasonal allergies     Past Surgical History:  Procedure Laterality Date   BREAST SURGERY     breast reduction   COLONOSCOPY     CYSTOSCOPY N/A 12/02/2016   Procedure: CYSTOSCOPY;  Surgeon: Nunzio Cobbs, MD;  Location: [facility];  Service: Gynecology;  Laterality: N/A;   ESOPHAGOGASTRODUODENOSCOPY ENDOSCOPY     HYSTERECTOMY ABDOMINAL WITH SALPINGECTOMY Bilateral 12/02/2016   Procedure: HYSTERECTOMY ABDOMINAL WITH SALPINGECTOMY;  Surgeon: Nunzio Cobbs, MD;  Location: [facility] [place] [facility];  Service: Gynecology;  Laterality: Bilateral;  possible TAH due to large fibroids   IR GENERIC HISTORICAL  05/07/2016   IR RADIOLOGIST EVAL & MGMT 05/07/2016 Greggory Keen, MD [facility] INTERV RAD   MYOMECTOMY  11/29/2009   Dr.Blondie Riggsbee Quin[facility] Simmonds - abdominal myomectomy   REDUCTION MAMMAPLASTY Bilateral  2000   WISDOM TOOTH EXTRACTION      Current Outpatient Medications  Medication Sig Dispense Refill   triamcinolone cream (KENALOG) 0.1 % Apply 1 application topically every evening. 80 g 2   Vitamin D, Ergocalciferol, (DRISDOL) 1.25 MG (50000 UNIT) CAPS capsule Take 50,000 Units by mouth once a week.     No current facility-administered medications for this visit.    Family History  Problem Relation Age of Onset   Cancer Mother 33       colon cancer   Diabetes Mother    Thyroid disease Mother        hyperthyroid   Diabetes Father    Hypertension Father    Dementia Father    Hypertension Sister    Hypertension Sister    Breast cancer Maternal Aunt 71       A & W   Cancer Paternal Uncle 60       colon cancer   Stroke Paternal Grandmother     Review of Systems  All other systems reviewed and are negative.   Exam:   BP 116/82 (BP Location: Left Arm, Patient Position: Sitting, Cuff Size: Normal)   Ht 5' 5.5" (1.664 m)   Wt 180 lb (81.6 kg)   LMP 06/24/2016   BMI 29.50 kg/m     General appearance:  alert, cooperative and appears stated age Head: normocephalic, without obvious abnormality, atraumatic Neck: no adenopathy, supple, symmetrical, trachea midline and thyroid normal to inspection and palpation Lungs: clear to auscultation bilaterally Breasts: normal appearance, no masses or tenderness, No nipple retraction or dimpling, No nipple discharge or bleeding, No axillary adenopathy Heart: regular rate and rhythm Abdomen: soft, non-tender; no masses, no organomegaly Extremities: extremities normal, atraumatic, no cyanosis or edema Skin: skin color, texture, turgor normal. No rashes or lesions Lymph nodes: cervical, supraclavicular, and axillary nodes normal. Neurologic: grossly normal  Pelvic: External genitalia:  no lesions              No abnormal inguinal nodes palpated.              Urethra:  normal appearing urethra with no masses, tenderness or lesions               Bartholins and Skenes: normal                 Vagina: normal appearing vagina with normal color and discharge, no lesions              Cervix: absent              Pap taken: no Bimanual Exam:  Uterus:  absent              Adnexa: no mass, fullness, tenderness              Rectal exam: yes.  Confirms.              Anus:  normal sphincter tone, no lesions  Chaperone was present for exam:   Raquel Sarna  Assessment:   Well woman visit with gynecologic exam. Status post TAH/bilateral salpingectomy.  Menopausal symptoms.  FH colon cancer.   Plan: Mammogram screening discussed. Self breast awareness reviewed. Pap and HR HPV not indicated. Guidelines for Calcium, Vitamin D, regular exercise program including cardiovascular and weight bearing exercise. Check FSH and estradiol.  Menopause discussed and brochure given.  Referral for colonoscopy.  Follow up annually and prn.   After visit summary provided.

## 2022-08-14 IMAGING — MG MM DIGITAL SCREENING BILAT W/ TOMO AND CAD
6 of 9 series · 6 of 25 positions shown · non-contrast
Comparison: Previous exam(s).

CLINICAL DATA: Screening.

EXAM:
DIGITAL SCREENING BILATERAL MAMMOGRAM WITH TOMOSYNTHESIS AND CAD
TECHNIQUE: Bilateral screening digital craniocaudal and mediolateral oblique
mammograms were obtained. Bilateral screening digital breast
tomosynthesis was performed. The images were evaluated with
computer-aided detection.

[L MLO synth-2D]
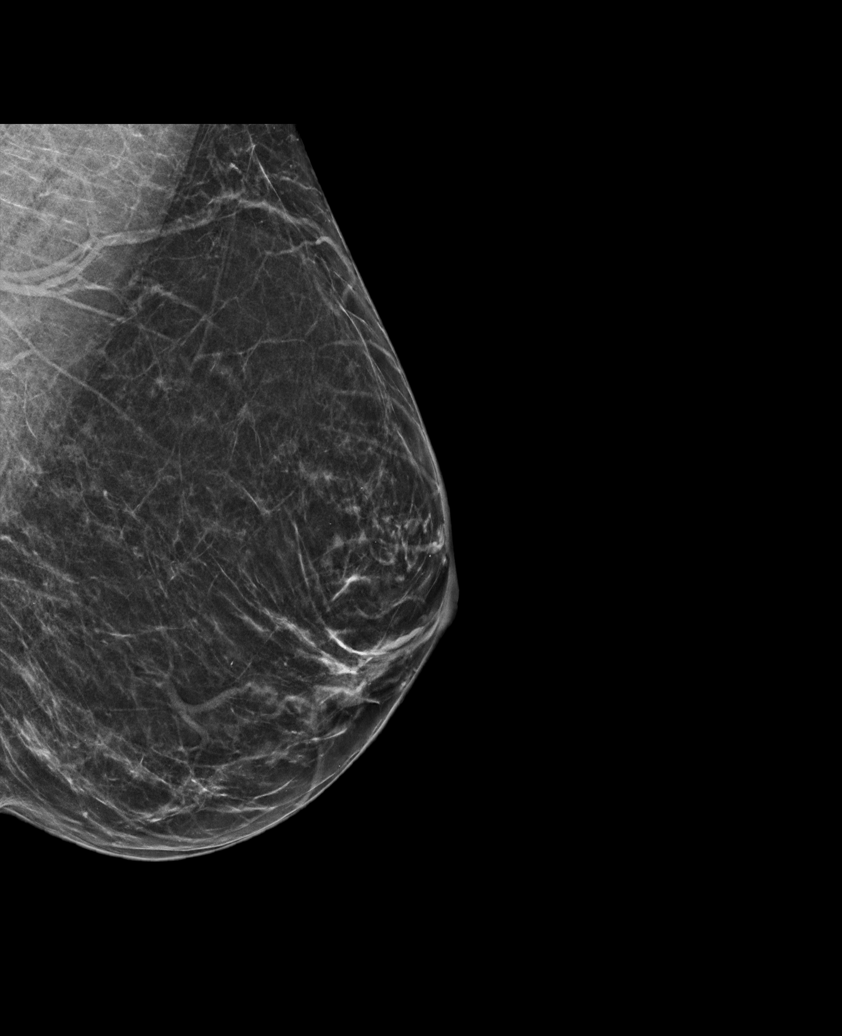

[R CC synth-2D (1 of 2)]
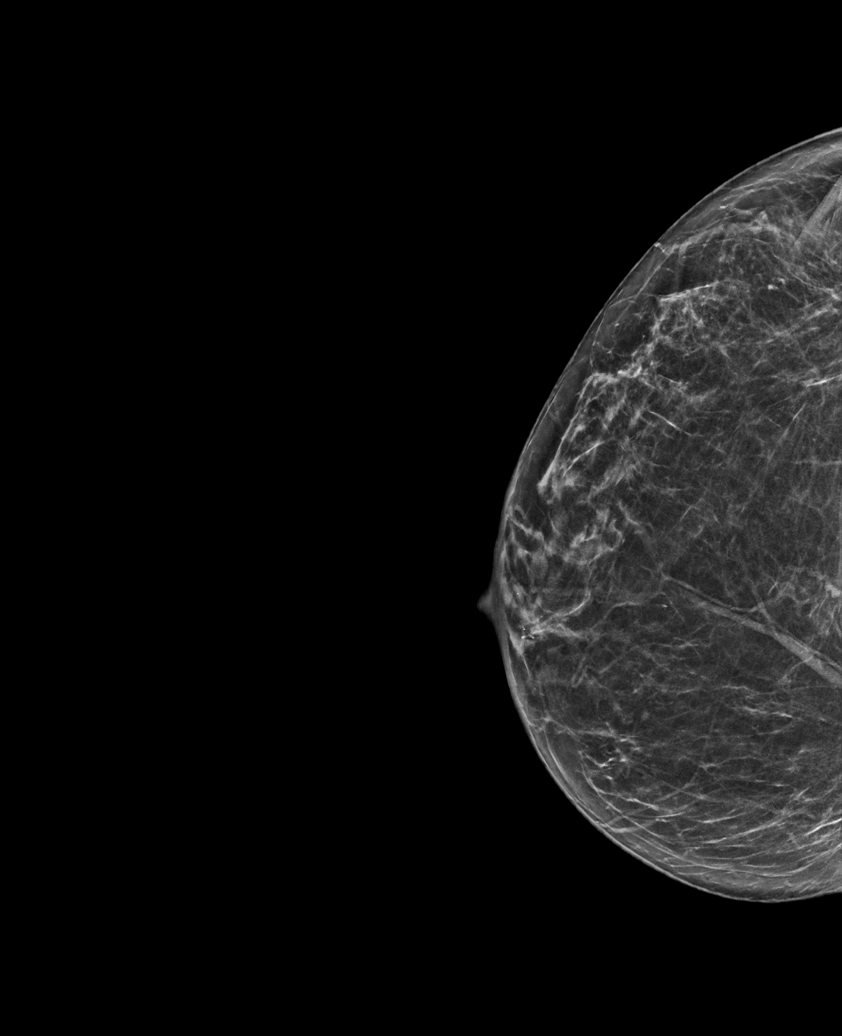

[R CC synth-2D (2 of 2)]
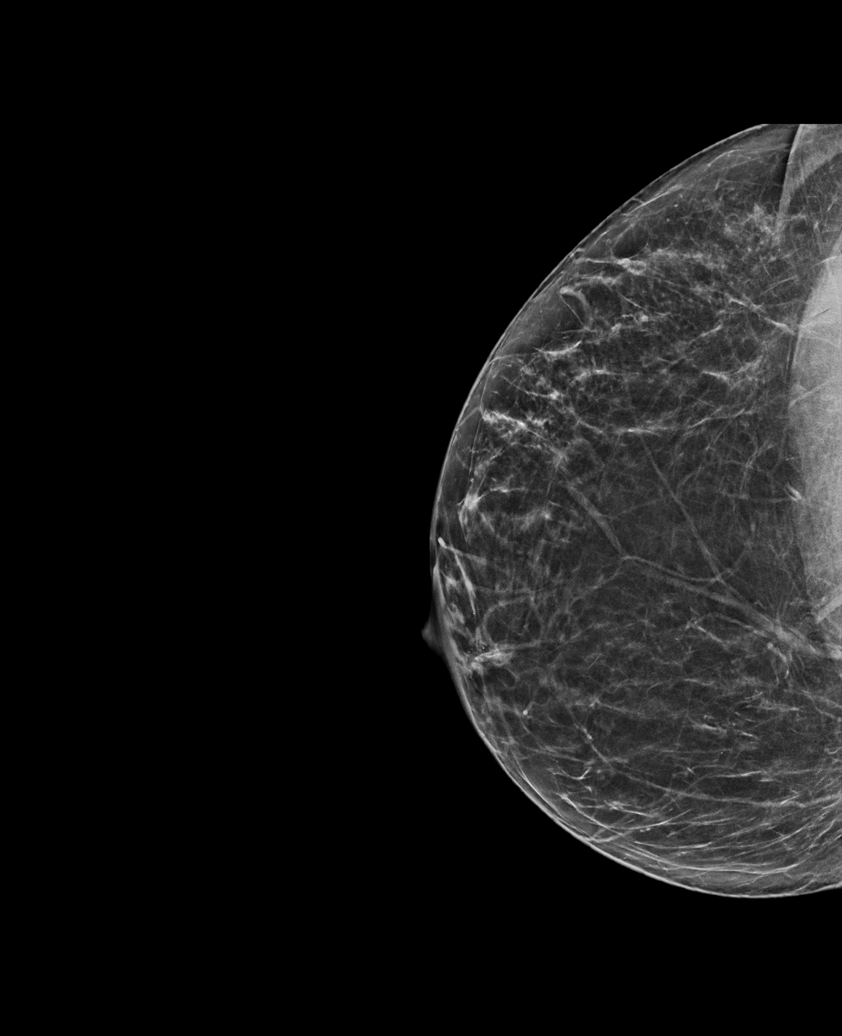

[L CC synth-2D]
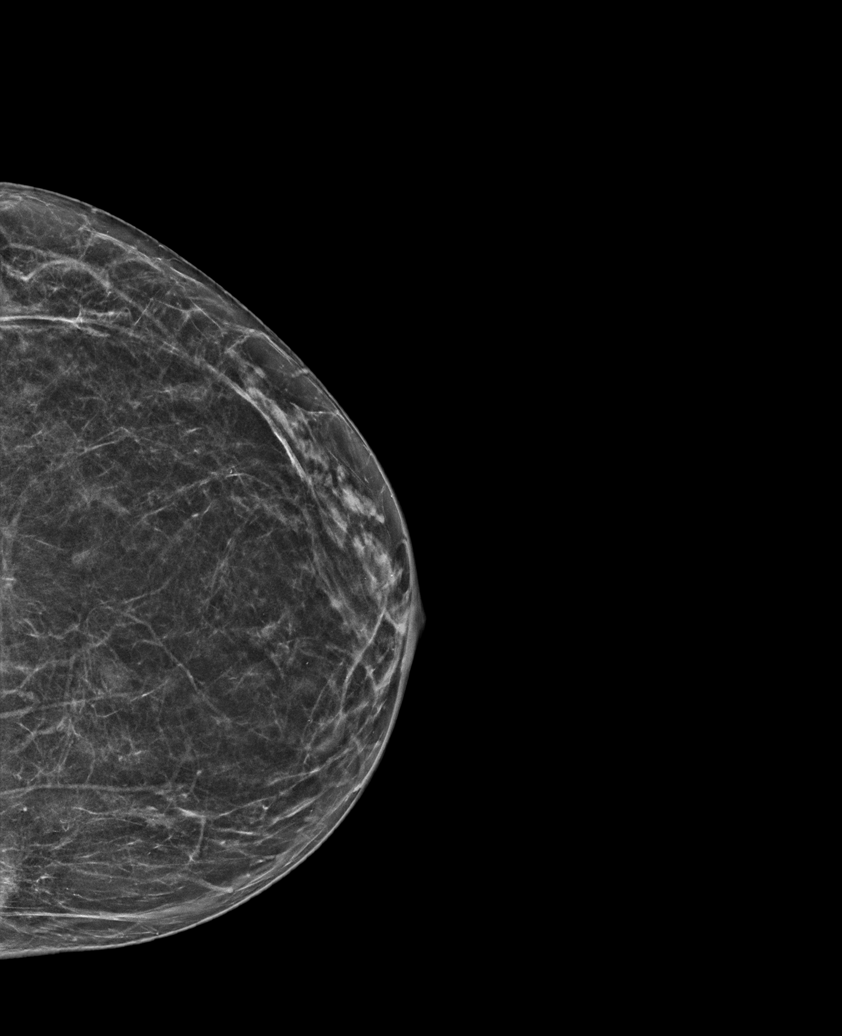

[R MLO synth-2D]
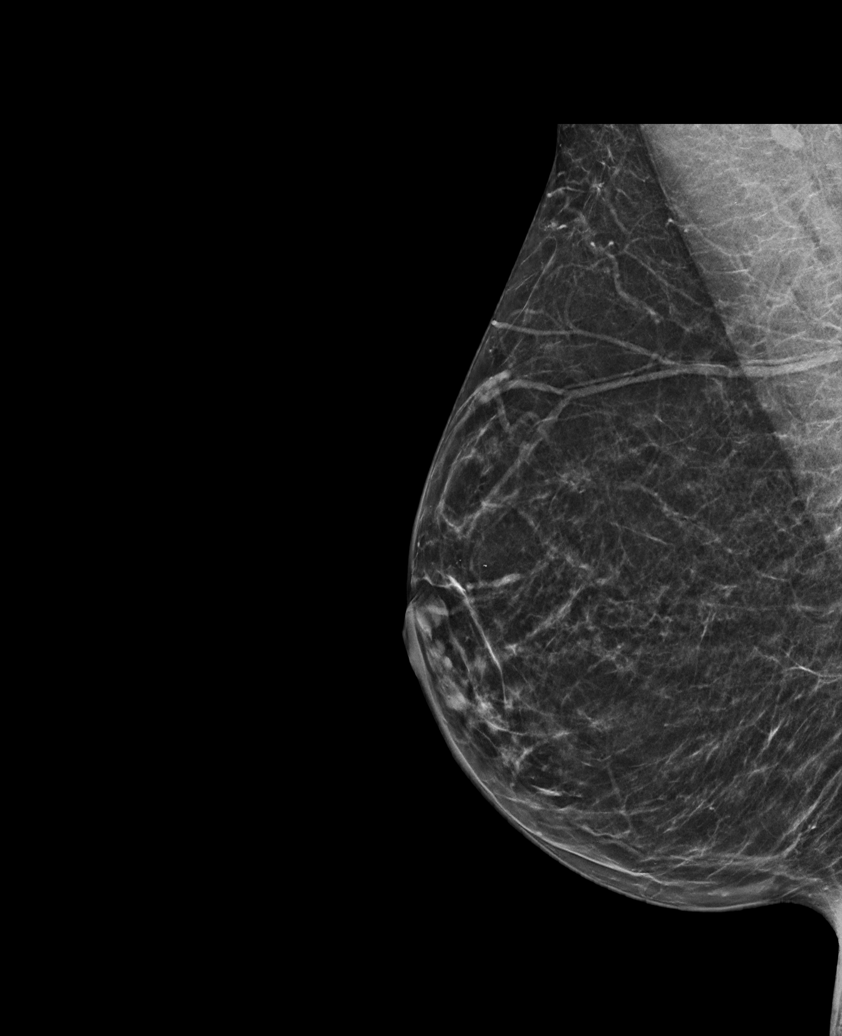

[R CC tomo · tomo slice 29/56.0]
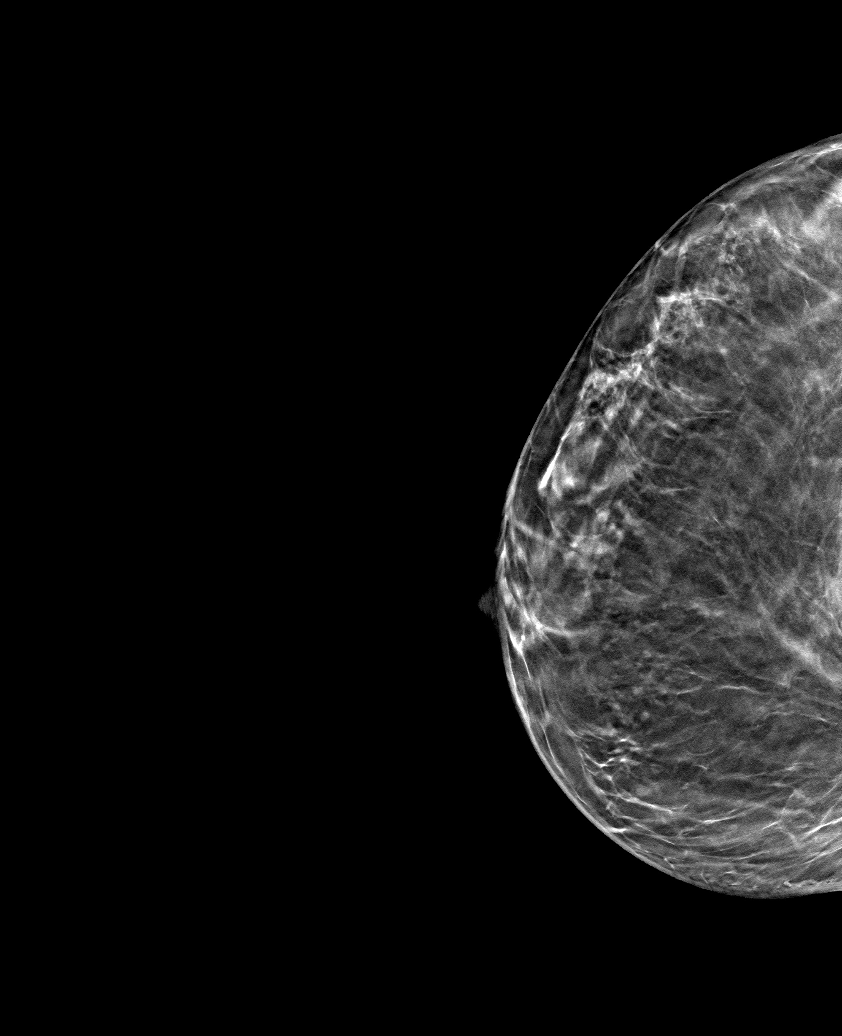

[6 of 25 positions shown; findings below may reference images not displayed]

ACR Breast Density Category b: There are scattered areas of
fibroglandular density.
FINDINGS: There are no findings suspicious for malignancy. The images were
evaluated with computer-aided detection.
IMPRESSION: No mammographic evidence of malignancy. A result letter of this
screening mammogram will be mailed directly to the patient.

RECOMMENDATION:
Screening mammogram in one year. (Code:WJ-I-BG6)

BI-RADS CATEGORY  1: Negative.

## 2022-08-19 ENCOUNTER — Encounter: Payer: Self-pay | Admitting: Obstetrics and Gynecology

## 2022-08-19 ENCOUNTER — Ambulatory Visit (INDEPENDENT_AMBULATORY_CARE_PROVIDER_SITE_OTHER): Payer: BC Managed Care – PPO | Admitting: Obstetrics and Gynecology

## 2022-08-19 VITALS — BP 116/82 | Ht 65.5 in | Wt 180.0 lb

## 2022-08-19 DIAGNOSIS — Z01419 Encounter for gynecological examination (general) (routine) without abnormal findings: Secondary | ICD-10-CM

## 2022-08-19 DIAGNOSIS — D259 Leiomyoma of uterus, unspecified: Secondary | ICD-10-CM | POA: Insufficient documentation

## 2022-08-19 DIAGNOSIS — K5900 Constipation, unspecified: Secondary | ICD-10-CM | POA: Insufficient documentation

## 2022-08-19 DIAGNOSIS — Z1211 Encounter for screening for malignant neoplasm of colon: Secondary | ICD-10-CM

## 2022-08-19 DIAGNOSIS — N951 Menopausal and female climacteric states: Secondary | ICD-10-CM

## 2022-08-19 LAB — ESTRADIOL: Estradiol: 155 pg/mL

## 2022-08-19 LAB — FOLLICLE STIMULATING HORMONE: FSH: 3.1 m[IU]/mL

## 2022-08-19 NOTE — Patient Instructions (Signed)

## 2022-08-19 NOTE — Addendum Note (Signed)
Addended by: Yisroel Ramming, Graycie Halley E on: 08/19/2022 11:13 AM   Modules accepted: Orders

## 2023-04-10 ENCOUNTER — Telehealth: Payer: Self-pay

## 2023-04-10 NOTE — Telephone Encounter (Signed)
Spoke w/ pt and advised her that we require an office visit for any prescription medications and inquired if we could get her scheduled.  Pt reported that she is currently trying to get scheduled with PCP in the morning but will reach back out if visit is needed.   Routing to provider for final review and closing encounter.

## 2023-04-10 NOTE — Telephone Encounter (Signed)
Patient left a message on triage voicemail stating she took an otc test & it showed positive for a uti. She asked if Dr Edward Jolly could call in an antibiotic other than amoxicillin for her.

## 2023-04-11 ENCOUNTER — Other Ambulatory Visit: Payer: Self-pay | Admitting: Obstetrics and Gynecology

## 2023-04-11 DIAGNOSIS — Z0000 Encounter for general adult medical examination without abnormal findings: Secondary | ICD-10-CM

## 2023-04-15 ENCOUNTER — Ambulatory Visit
Admission: RE | Admit: 2023-04-15 | Discharge: 2023-04-15 | Disposition: A | Discharge status: Home / Self Care | Payer: 59 | Source: Ambulatory Visit | Attending: Obstetrics and Gynecology | Admitting: Obstetrics and Gynecology

## 2023-04-15 DIAGNOSIS — Z0000 Encounter for general adult medical examination without abnormal findings: Secondary | ICD-10-CM

## 2023-04-15 DIAGNOSIS — Z1231 Encounter for screening mammogram for malignant neoplasm of breast: Secondary | ICD-10-CM | POA: Diagnosis not present

## 2023-06-12 ENCOUNTER — Encounter: Payer: Self-pay | Admitting: Obstetrics and Gynecology

## 2023-06-12 ENCOUNTER — Ambulatory Visit (INDEPENDENT_AMBULATORY_CARE_PROVIDER_SITE_OTHER): Payer: 59 | Admitting: Obstetrics and Gynecology

## 2023-06-12 VITALS — BP 120/84 | HR 97 | Ht 65.5 in | Wt 185.0 lb

## 2023-06-12 DIAGNOSIS — N3091 Cystitis, unspecified with hematuria: Secondary | ICD-10-CM | POA: Diagnosis not present

## 2023-06-12 DIAGNOSIS — K5909 Other constipation: Secondary | ICD-10-CM | POA: Diagnosis not present

## 2023-06-12 DIAGNOSIS — R350 Frequency of micturition: Secondary | ICD-10-CM | POA: Diagnosis not present

## 2023-06-12 DIAGNOSIS — M545 Low back pain, unspecified: Secondary | ICD-10-CM | POA: Diagnosis not present

## 2023-06-12 DIAGNOSIS — R109 Unspecified abdominal pain: Secondary | ICD-10-CM | POA: Diagnosis not present

## 2023-06-12 MED ORDER — PHENAZOPYRIDINE HCL 200 MG PO TABS: 200.0000 mg | ORAL_TABLET | Freq: Three times a day (TID) | ORAL | 0 refills | Status: AC | PRN

## 2023-06-12 MED ORDER — AMOXICILLIN-POT CLAVULANATE 875-125 MG PO TABS: 1.0000 | ORAL_TABLET | Freq: Two times a day (BID) | ORAL | 0 refills | Status: AC

## 2023-06-12 NOTE — Progress Notes (Signed)
 GYNECOLOGY  VISIT   HPI: 48 y.o.   Married  African American female   G0P0000 with Patient's last menstrual period was 06/24/2016.   here for: UTI-- reports hematuria, lower right back pain and urinary frequency since last night. Some slight dysuria.  No fever or nausea.   No recent sex.   No hx of kidney stone.   Had a UTI end of October.   She correlates her UTIs with constipation.  No BM in 5 - 6 days, so she took Magnesium citrate which gave her diarrhea.  Miralax has not helped constipation. She used Linzess in the past which helped, but insurance does not cover this.  Stool softeners cause nausea and diarrhea.   GYNECOLOGIC HISTORY: Patient's last menstrual period was 06/24/2016. 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   Seborrheic dermatitis, unspecified 01/23/2017   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/02/2016    Past Medical History:  Diagnosis Date   Constipation    Elevated hemoglobin A1c 12/12/2016   5.7   Fibroids    GERD (gastroesophageal reflux disease)    Low vitamin D level    Patient is Jehovah's Witness    Seasonal allergies     Past Surgical History:  Procedure Laterality Date   BREAST SURGERY     breast reduction   COLONOSCOPY     CYSTOSCOPY N/A 12/02/2016   Procedure: CYSTOSCOPY;  Surgeon: Cathlyn JAYSON Nikki Bobie FORBES, MD;  Location: [facility];  Service: Gynecology;   Laterality: N/A;   ESOPHAGOGASTRODUODENOSCOPY ENDOSCOPY     HYSTERECTOMY ABDOMINAL WITH SALPINGECTOMY Bilateral 12/02/2016   Procedure: HYSTERECTOMY ABDOMINAL WITH SALPINGECTOMY;  Surgeon: Cathlyn JAYSON Nikki Bobie FORBES, MD;  Location: [facility];  Service: Gynecology;  Laterality: Bilateral;  possible TAH due to large fibroids   IR GENERIC HISTORICAL  05/07/2016   IR RADIOLOGIST EVAL & MGMT 05/07/2016 Ozell Specking, MD [facility] INTERV RAD   MYOMECTOMY  11/29/2009   Dr.Kahiau Schewe Nikki - abdominal myomectomy   REDUCTION MAMMAPLASTY Bilateral 2000   WISDOM TOOTH EXTRACTION      Current Outpatient Medications  Medication Sig Dispense Refill   triamcinolone  cream (KENALOG ) 0.1 % Apply 1 application topically every evening. 80 g 2   Vitamin D, Ergocalciferol, (DRISDOL) 1.25 MG (50000 UNIT) CAPS capsule Take 50,000 Units by mouth once a week.     No current facility-administered medications for this visit.     ALLERGIES: Patient has no known allergies.  Family History  Problem Relation Age of Onset   Cancer Mother 105       colon cancer   Diabetes Mother    Thyroid disease Mother  hyperthyroid   Diabetes Father    Hypertension Father    Dementia Father    Hypertension Sister    Hypertension Sister    Breast cancer Maternal Aunt 85       A & W   Cancer Paternal Uncle 71       colon cancer   Stroke Paternal Grandmother     Social History   Socioeconomic History   Marital status: Married    Spouse name: Not on file   Number of children: Not on file   Years of education: Not on file   Highest education level: Not on file  Occupational History   Not on file  Tobacco Use   Smoking status: Never   Smokeless tobacco: Never  Vaping Use   Vaping status: Never Used  Substance and Sexual Activity   Alcohol use: Yes    Alcohol/week: 4.0 standard drinks of alcohol    Types: 2 Glasses of wine, 2 Shots of liquor per week   Drug use: No   Sexual activity: Yes    Partners: Male    Birth  control/protection: Surgical    Comment: Hysterectomy  Other Topics Concern   Not on file  Social History Narrative   Not on file   Social Drivers of Health   Financial Resource Strain: Not on file  Food Insecurity: Not on file  Transportation Needs: Not on file  Physical Activity: Not on file  Stress: Not on file  Social Connections: Not on file  Intimate Partner Violence: Not on file    Review of Systems  Genitourinary:  Positive for dysuria, flank pain and frequency.    PHYSICAL EXAMINATION:   Ht 5' 5.5 (1.664 m)   Wt 185 lb (83.9 kg)   LMP 06/24/2016   BMI 30.32 kg/m     General appearance: alert, cooperative and appears stated age   Pelvic: External genitalia:  no lesions              Urethra:  normal appearing urethra with no masses, tenderness or lesions              Bartholins and Skenes: normal                 Vagina: normal appearing vagina with normal color and discharge, no lesions              Cervix:  absent                Bimanual Exam:  Uterus:  absent              Adnexa: no mass, fullness, tenderness               Chaperone was present for exam:  Damien FALCON, CMA  ASSESSMENT:  Hemorrhagic cystitis.  Urinary frequency.  Lower right back pain.  Chronic constipation.   PLAN:  Urinalysis:sg 1.025, ph 7.0, 2+ protein, trace leukocyte esterase, 0 - 5 WBC, > 60 RBC, FEW BACTERIA. UC sent. Augmentin  875/125 mg twice daily for one week.  Pyridium  200 mg po bid x 2 days prn.  Hydrate well.  Call if no improvement in 48 hours.  She will contact her GI about getting back on Linzess. FU here prn.  20 min  total time was spent for this patient encounter, including preparation, face-to-face counseling with the patient, coordination of care, and documentation of the encounter.

## 2023-06-12 NOTE — Patient Instructions (Signed)
Urinary Tract Infection, Adult  A urinary tract infection (UTI) is an infection of any part of the urinary tract. The urinary tract includes the kidneys, ureters, bladder, and urethra. These organs make, store, and get rid of urine in the body. An upper UTI affects the ureters and kidneys. A lower UTI affects the bladder and urethra. What are the causes? Most urinary tract infections are caused by bacteria in your genital area around your urethra, where urine leaves your body. These bacteria grow and cause inflammation of your urinary tract. What increases the risk? You are more likely to develop this condition if: You have a urinary catheter that stays in place. You are not able to control when you urinate or have a bowel movement (incontinence). You are female and you: Use a spermicide or diaphragm for birth control. Have low estrogen levels. Are pregnant. You have certain genes that increase your risk. You are sexually active. You take antibiotic medicines. You have a condition that causes your flow of urine to slow down, such as: An enlarged prostate, if you are female. Blockage in your urethra. A kidney stone. A nerve condition that affects your bladder control (neurogenic bladder). Not getting enough to drink, or not urinating often. You have certain medical conditions, such as: Diabetes. A weak disease-fighting system (immunesystem). Sickle cell disease. Gout. Spinal cord injury. What are the signs or symptoms? Symptoms of this condition include: Needing to urinate right away (urgency). Frequent urination. This may include small amounts of urine each time you urinate. Pain or burning with urination. Blood in the urine. Urine that smells bad or unusual. Trouble urinating. Cloudy urine. Vaginal discharge, if you are female. Pain in the abdomen or the lower back. You may also have: Vomiting or a decreased appetite. Confusion. Irritability or tiredness. A fever or  chills. Diarrhea. The first symptom in older adults may be confusion. In some cases, they may not have any symptoms until the infection has worsened. How is this diagnosed? This condition is diagnosed based on your medical history and a physical exam. You may also have other tests, including: Urine tests. Blood tests. Tests for STIs (sexually transmitted infections). If you have had more than one UTI, a cystoscopy or imaging studies may be done to determine the cause of the infections. How is this treated? Treatment for this condition includes: Antibiotic medicine. Over-the-counter medicines to treat discomfort. Drinking enough water to stay hydrated. If you have frequent infections or have other conditions such as a kidney stone, you may need to see a health care provider who specializes in the urinary tract (urologist). In rare cases, urinary tract infections can cause sepsis. Sepsis is a life-threatening condition that occurs when the body responds to an infection. Sepsis is treated in the hospital with IV antibiotics, fluids, and other medicines. Follow these instructions at home:  Medicines Take over-the-counter and prescription medicines only as told by your health care provider. If you were prescribed an antibiotic medicine, take it as told by your health care provider. Do not stop using the antibiotic even if you start to feel better. General instructions Make sure you: Empty your bladder often and completely. Do not hold urine for long periods of time. Empty your bladder after sex. Wipe from front to back after urinating or having a bowel movement if you are female. Use each tissue only one time when you wipe. Drink enough fluid to keep your urine pale yellow. Keep all follow-up visits. This is important. Contact a health   care provider if: Your symptoms do not get better after 1-2 days. Your symptoms go away and then return. Get help right away if: You have severe pain in  your back or your lower abdomen. You have a fever or chills. You have nausea or vomiting. Summary A urinary tract infection (UTI) is an infection of any part of the urinary tract, which includes the kidneys, ureters, bladder, and urethra. Most urinary tract infections are caused by bacteria in your genital area. Treatment for this condition often includes antibiotic medicines. If you were prescribed an antibiotic medicine, take it as told by your health care provider. Do not stop using the antibiotic even if you start to feel better. Keep all follow-up visits. This is important. This information is not intended to replace advice given to you by your health care provider. Make sure you discuss any questions you have with your health care provider. Document Revised: 01/02/2020 Document Reviewed: 01/07/2020 [employer] Patient Education  2024 [employer]  

## 2023-06-14 LAB — URINALYSIS, COMPLETE W/RFL CULTURE
Bilirubin Urine: NEGATIVE
Casts: NONE SEEN /[LPF]
Crystals: NONE SEEN /[HPF]
Glucose, UA: NEGATIVE
Ketones, ur: NEGATIVE
Nitrites, Initial: NEGATIVE
RBC / HPF: >=60 /[HPF] — AB (ref 0–2)
Specific Gravity, Urine: 1.025 (ref 1.001–1.035)
Yeast: NONE SEEN /[HPF]
pH: 7 (ref 5.0–8.0)

## 2023-06-14 LAB — URINE CULTURE
MICRO NUMBER:: 15909819
SPECIMEN QUALITY:: ADEQUATE

## 2023-06-14 LAB — CULTURE INDICATED

## 2024-03-30 ENCOUNTER — Other Ambulatory Visit: Payer: Self-pay | Admitting: Obstetrics and Gynecology

## 2024-03-30 DIAGNOSIS — Z1231 Encounter for screening mammogram for malignant neoplasm of breast: Secondary | ICD-10-CM

## 2024-04-02 DIAGNOSIS — Z13220 Encounter for screening for lipoid disorders: Secondary | ICD-10-CM | POA: Diagnosis not present

## 2024-04-02 DIAGNOSIS — Z79899 Other long term (current) drug therapy: Secondary | ICD-10-CM | POA: Diagnosis not present

## 2024-04-14 DIAGNOSIS — Z800 Family history of malignant neoplasm of digestive organs: Secondary | ICD-10-CM | POA: Diagnosis not present

## 2024-04-14 DIAGNOSIS — K6389 Other specified diseases of intestine: Secondary | ICD-10-CM | POA: Diagnosis not present

## 2024-04-14 DIAGNOSIS — Z1211 Encounter for screening for malignant neoplasm of colon: Secondary | ICD-10-CM | POA: Diagnosis not present

## 2024-04-14 LAB — HM COLONOSCOPY

## 2024-04-16 ENCOUNTER — Ambulatory Visit

## 2024-04-21 ENCOUNTER — Ambulatory Visit

## 2024-05-26 ENCOUNTER — Ambulatory Visit
